# Patient Record
Sex: Male | Born: 1950 | Race: Black or African American | Hispanic: No | Marital: Single | Smoking: Current every day smoker
Health system: Southern US, Community
[De-identification: ages and names within clinical notes are randomized; demographics above are authoritative.]

## PROBLEM LIST (undated history)

## (undated) DIAGNOSIS — R569 Unspecified convulsions: Secondary | ICD-10-CM

## (undated) DIAGNOSIS — F209 Schizophrenia, unspecified: Secondary | ICD-10-CM

## (undated) DIAGNOSIS — I219 Acute myocardial infarction, unspecified: Secondary | ICD-10-CM

## (undated) HISTORY — PX: CARDIAC SURGERY: SHX584

## (undated) HISTORY — PX: AXILLARY VEIN - INTERNAL JUGULAR BYPASS GRAFT: SUR172

---

## 2010-03-21 ENCOUNTER — Emergency Department (HOSPITAL_COMMUNITY)
Admission: EM | Admit: 2010-03-21 | Discharge: 2010-03-21 | Payer: Self-pay | Source: Home / Self Care | Admitting: Emergency Medicine

## 2010-05-14 ENCOUNTER — Emergency Department (HOSPITAL_COMMUNITY)
Admission: EM | Admit: 2010-05-14 | Discharge: 2010-05-14 | Discharge status: Against Medical Advice | Payer: Medicaid Other | Attending: Emergency Medicine | Admitting: Emergency Medicine

## 2010-05-14 DIAGNOSIS — Y929 Unspecified place or not applicable: Secondary | ICD-10-CM | POA: Insufficient documentation

## 2010-05-14 DIAGNOSIS — Z79899 Other long term (current) drug therapy: Secondary | ICD-10-CM | POA: Insufficient documentation

## 2010-05-14 DIAGNOSIS — G40909 Epilepsy, unspecified, not intractable, without status epilepticus: Secondary | ICD-10-CM | POA: Insufficient documentation

## 2010-05-14 DIAGNOSIS — IMO0001 Reserved for inherently not codable concepts without codable children: Secondary | ICD-10-CM | POA: Insufficient documentation

## 2010-10-01 ENCOUNTER — Emergency Department (HOSPITAL_COMMUNITY)
Admission: EM | Admit: 2010-10-01 | Discharge: 2010-10-01 | Discharge status: Against Medical Advice | Payer: Medicaid Other | Source: Home / Self Care | Attending: Emergency Medicine | Admitting: Emergency Medicine

## 2010-10-01 ENCOUNTER — Emergency Department (HOSPITAL_COMMUNITY)
Admission: EM | Admit: 2010-10-01 | Discharge: 2010-10-01 | Discharge status: Against Medical Advice | Payer: Medicaid Other | Attending: Emergency Medicine | Admitting: Emergency Medicine

## 2010-10-01 DIAGNOSIS — R51 Headache: Secondary | ICD-10-CM | POA: Insufficient documentation

## 2010-10-01 DIAGNOSIS — R079 Chest pain, unspecified: Secondary | ICD-10-CM | POA: Insufficient documentation

## 2010-10-01 DIAGNOSIS — M79609 Pain in unspecified limb: Secondary | ICD-10-CM | POA: Insufficient documentation

## 2011-03-30 ENCOUNTER — Emergency Department (HOSPITAL_COMMUNITY)
Admission: EM | Admit: 2011-03-30 | Discharge: 2011-03-30 | Discharge status: Against Medical Advice | Payer: Medicaid Other | Attending: Emergency Medicine | Admitting: Emergency Medicine

## 2011-03-30 DIAGNOSIS — Z0389 Encounter for observation for other suspected diseases and conditions ruled out: Secondary | ICD-10-CM | POA: Insufficient documentation

## 2011-03-30 NOTE — ED Notes (Signed)
Pt sts he is going to cafeteria; pt speaking to everyone in waiting room; pt sts does not want to be seen now

## 2011-04-30 ENCOUNTER — Encounter (HOSPITAL_COMMUNITY): Payer: Self-pay

## 2011-04-30 ENCOUNTER — Emergency Department (HOSPITAL_COMMUNITY)
Admission: EM | Admit: 2011-04-30 | Discharge: 2011-04-30 | Disposition: A | Discharge status: Home / Self Care | Payer: Medicaid Other | Attending: Emergency Medicine | Admitting: Emergency Medicine

## 2011-04-30 ENCOUNTER — Emergency Department (HOSPITAL_COMMUNITY)
Admission: EM | Admit: 2011-04-30 | Discharge: 2011-04-30 | Disposition: A | Discharge status: Home / Self Care | Payer: Medicaid Other | Source: Home / Self Care | Attending: Emergency Medicine | Admitting: Emergency Medicine

## 2011-04-30 ENCOUNTER — Encounter (HOSPITAL_COMMUNITY): Payer: Self-pay | Admitting: *Deleted

## 2011-04-30 DIAGNOSIS — Z79899 Other long term (current) drug therapy: Secondary | ICD-10-CM | POA: Insufficient documentation

## 2011-04-30 DIAGNOSIS — Z8659 Personal history of other mental and behavioral disorders: Secondary | ICD-10-CM | POA: Insufficient documentation

## 2011-04-30 DIAGNOSIS — M79609 Pain in unspecified limb: Secondary | ICD-10-CM | POA: Insufficient documentation

## 2011-04-30 DIAGNOSIS — F209 Schizophrenia, unspecified: Secondary | ICD-10-CM | POA: Insufficient documentation

## 2011-04-30 DIAGNOSIS — M79676 Pain in unspecified toe(s): Secondary | ICD-10-CM

## 2011-04-30 DIAGNOSIS — R569 Unspecified convulsions: Secondary | ICD-10-CM | POA: Insufficient documentation

## 2011-04-30 DIAGNOSIS — Z5900 Homelessness unspecified: Secondary | ICD-10-CM | POA: Insufficient documentation

## 2011-04-30 DIAGNOSIS — B351 Tinea unguium: Secondary | ICD-10-CM | POA: Insufficient documentation

## 2011-04-30 DIAGNOSIS — F17200 Nicotine dependence, unspecified, uncomplicated: Secondary | ICD-10-CM | POA: Insufficient documentation

## 2011-04-30 HISTORY — DX: Unspecified convulsions: R56.9

## 2011-04-30 HISTORY — DX: Schizophrenia, unspecified: F20.9

## 2011-04-30 MED ORDER — LIDOCAINE HCL 2 % IJ SOLN
10.0000 mL | Freq: Once | INTRAMUSCULAR | Status: DC
Start: 2011-04-30 — End: 2011-05-01
  Filled 2011-04-30: qty 1

## 2011-04-30 NOTE — ED Notes (Signed)
CSW met with Pt to discuss living situation. Pt got in fight with wife and is now homeless. Pt has hx of homelessness and is familiar with local resources and process for shelter. Pt reports last at at [facility] 7 months ago. Pt spends day at [facility]. CSW gave Pt a few clothing items, bus pass, and list of places to eat each meal 7 days a week. Pt verbally expressed gratitude for assistance.

## 2011-04-30 NOTE — ED Notes (Signed)
Pt to be discharged home. Pt uncooperative with EDP and nursing staff. Social worker gave pt clothes. Pt provided with bus ticket and lunch. Pt refusing to sign discharge papers. Alert and oriented x 4. NAD. EDP reporting have security escort paper off campus. Security at bedside

## 2011-04-30 NOTE — ED Notes (Signed)
Pt reports ingrown toe nail to right great toe x 6 months. States getting worse, mild swelling noted.

## 2011-04-30 NOTE — Discharge Instructions (Signed)
Come back when you change your attitude and refrain from swearing at health care providers.

## 2011-04-30 NOTE — ED Notes (Signed)
Pt complains of right great toe nail is ingrown and is too long and wants it removed

## 2011-04-30 NOTE — ED Notes (Signed)
Patient reports he and his wife split last night,  He is now homeless.  Social worker called to come talk with patient

## 2011-04-30 NOTE — ED Provider Notes (Addendum)
History     CSN: [id number]  Arrival date & time 04/30/11  1001   First MD Initiated Contact with Patient 04/30/11 1102      Chief Complaint  Patient presents with  . Toe Pain    (Consider location/radiation/quality/duration/timing/severity/associated sxs/prior treatment) The history is provided by the patient.  pt c/o nontraumatic pain in right gt toe due to toenail.  Says it is ingrown.  No other complaints.  Past Medical History  Diagnosis Date  . Schizophrenia   . Seizures     History reviewed. No pertinent past surgical history.  History reviewed. No pertinent family history.  History  Substance Use Topics  . Smoking status: Current Everyday Smoker  . Smokeless tobacco: Not on file  . Alcohol Use: Yes      Review of Systems  Musculoskeletal:       Toe nail pain    Allergies  Review of patient's allergies indicates no known allergies.  Home Medications   Current Outpatient Rx  Name Route Sig Dispense Refill  . RISPERIDONE 0.25 MG PO TABS Oral Take 0.25 mg by mouth at bedtime.      BP 109/80  Pulse 81  Temp(Src) 98.3 F (36.8 C) (Oral)  Resp 20  SpO2 100%  Physical Exam  Constitutional: He appears well-developed and well-nourished. No distress.  HENT:  Head: Normocephalic.  Eyes: Pupils are equal, round, and reactive to light.  Neck: Normal range of motion.  Pulmonary/Chest: Effort normal.  Musculoskeletal:       Hypertrophied right toenail with fungal infection. Not ingrown.  No erythema, edema of soft tissue of toe.    ED Course  Procedures (including critical care time) Pt began swearing at me and using foul language.  I discharged him without any tx.  There is no medical emergency.  Labs Reviewed - No data to display No results found.   No diagnosis found.    MDM  Toe nail hypertrophy No ingrown nail        Nicholes Stairs, MD 04/30/11 1117  Nicholes Stairs, MD 04/30/11 1121

## 2011-04-30 NOTE — ED Notes (Signed)
Patient states the right great toe nail is ingrown and painful.  He has been dealing with the pain for 6 mths

## 2011-04-30 NOTE — ED Notes (Signed)
Patient is refusing to sign his d/c paper work.

## 2011-04-30 NOTE — ED Notes (Signed)
Pt was seen by [employer] to have been walking down [employer] toward [employer].

## 2011-04-30 NOTE — ED Provider Notes (Signed)
History     CSN: [id number]  Arrival date & time 04/30/11  2223   First MD Initiated Contact with Patient 04/30/11 2247      Chief Complaint  Patient presents with  . Ingrown Toenail    pt c/o pain to right great toe, states he has had broken toenail x's 6 months.     (Consider location/radiation/quality/duration/timing/severity/associated sxs/prior treatment) Patient is a 61 y.o. male presenting with toe pain. The history is provided by the patient. No language interpreter was used.  Toe Pain This is a recurrent problem. The current episode started in the past 7 days. The problem has been gradually worsening. Pertinent negatives include no fever, nausea, vomiting or weakness. The symptoms are aggravated by walking.  homeless individual with R toe pain x 6 months.  Past Medical History  Diagnosis Date  . Schizophrenia   . Seizures     Past Surgical History  Procedure Date  . 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 No pertinent family history.  History  Substance Use Topics  . Smoking status: Current Everyday Smoker  . Smokeless tobacco: Not on file  . Alcohol Use: Yes      Review of Systems  Constitutional: Negative for fever.  Gastrointestinal: Negative for nausea and vomiting.  Neurological: Negative for weakness.  All other systems reviewed and are negative.    Allergies  Review of patient's allergies indicates no known allergies.  Home Medications   Current Outpatient Rx  Name Route Sig Dispense Refill  . RISPERIDONE 0.25 MG PO TABS Oral Take 0.25 mg by mouth at bedtime.      BP 112/73  Pulse 87  Temp(Src) 98.4 F (36.9 C) (Oral)  Resp 20  Wt 136 lb 11.2 oz (62.007 kg)  SpO2 100%  Physical Exam  Nursing note and vitals reviewed. Constitutional: He is oriented to person, place, and time. He appears well-developed and well-nourished.  HENT:  Head: Normocephalic and atraumatic.  Eyes: Pupils are equal, round, and  reactive to light.  Neck: Neck supple.  Cardiovascular: Normal rate and regular rhythm.  Exam reveals no gallop and no friction rub.   No murmur heard. Pulmonary/Chest: Breath sounds normal. No respiratory distress.  Abdominal: Soft. He exhibits no distension.  Musculoskeletal: Normal range of motion.       R great toe pain with ingrown toenail  Neurological: He is alert and oriented to person, place, and time. No cranial nerve deficit.  Skin: Skin is warm and dry.  Psychiatric: He has a normal mood and affect.    ED Course  Procedures (including critical care time)  Labs Reviewed - No data to display No results found.   No diagnosis found.    MDM  Homeless Patient here in the current system for the second time today with complaint of right toe pain. States he does have a ingrown toenail and he would like to have the whole thing cut off tonight. When I went to numb  his toe up he would not let me,  therefore we will not do the procedure at this time. The toe does not appear to be infected. Patient spoke with the social worker at [employer] [facility] today. He was given clothes,  a bus pass and 7 meal tickets. He was escorted off campus by security. Will disposition him to discharge.        Jethro Bastos, NP 05/02/11 [phone]

## 2011-04-30 NOTE — ED Notes (Signed)
Pt asking to speak with social worker, states he is homeless, doesn't have anywhere to stay tonight or a ride home.

## 2011-04-30 NOTE — ED Notes (Signed)
Pt states he also wants to see a Child psychotherapist because he is homeless and needs help with getting appropriate clothing.

## 2011-05-02 NOTE — ED Provider Notes (Signed)
Medical screening examination/treatment/procedure(s) were performed by non-physician practitioner and as supervising physician I was immediately available for consultation/collaboration.   Glynn Octave, MD 05/02/11 1949

## 2011-05-03 NOTE — ED Provider Notes (Signed)
History     CSN: [id number]  Arrival date & time 04/30/11  1256   First MD Initiated Contact with Patient 04/30/11 1504      Chief Complaint  Patient presents with  . Toe Pain    (Consider location/radiation/quality/duration/timing/severity/associated sxs/prior treatment) Patient is a 61 y.o. male presenting with toe pain. The history is provided by the patient. No language interpreter was used.  Toe Pain This is a chronic problem. The current episode started more than 1 month ago. The problem occurs constantly. The problem has been gradually worsening. Pertinent negatives include no chills, fever, numbness or vomiting. The symptoms are aggravated by walking. He has tried nothing for the symptoms.  Reports ingrown toenail on the R big toe x 6 months.  Patient is homeless and walks a lot.  Was at [facility] facility earlier today and escorted off property after he was given food vouchers, clothes and bus pass.   Past Medical History  Diagnosis Date  . Schizophrenia   . Seizures     Past Surgical History  Procedure Date  . 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  History  Substance Use Topics  . Smoking status: Current Everyday Smoker  . Smokeless tobacco: Not on file  . Alcohol Use: Yes      Review of Systems  Constitutional: Negative for fever and chills.  Gastrointestinal: Negative for vomiting.  Neurological: Negative for numbness.  All other systems reviewed and are negative.    Allergies  Review of patient's allergies indicates no known allergies.  Home Medications   Current Outpatient Rx  Name Route Sig Dispense Refill  . RISPERIDONE 0.25 MG PO TABS Oral Take 0.25 mg by mouth at bedtime.      BP 123/84  Pulse 77  Temp(Src) 98 F (36.7 C) (Oral)  Resp 16  Physical Exam  Nursing note and vitals reviewed. Constitutional: He is oriented to person, place, and time. He appears well-developed and  well-nourished.  HENT:  Head: Normocephalic and atraumatic.  Eyes: Pupils are equal, round, and reactive to light.  Neck: Normal range of motion.  Cardiovascular: Normal rate and regular rhythm.  Exam reveals no gallop and no friction rub.   No murmur heard. Pulmonary/Chest: Effort normal and breath sounds normal. No respiratory distress.  Abdominal: Soft. He exhibits no distension.  Musculoskeletal: Normal range of motion.       R great toe tender to touch.  Does not appear infected or reddened.  Neurological: He is alert and oriented to person, place, and time. No cranial nerve deficit.  Skin: Skin is warm and dry.  Psychiatric: He has a normal mood and affect.    ED Course  Procedures (including critical care time)  Labs Reviewed - No data to display No results found.   No diagnosis found.    MDM  Attempted digital block for ? Ingrown toenail.  Patient refused the block.  Homeless and was escorted off [facility] property earlier today.  No acute distress noted.        Jethro Bastos, NP 05/03/11 1742  Jethro Bastos, NP 05/03/11 1743

## 2011-05-04 NOTE — ED Provider Notes (Signed)
Medical screening examination/treatment/procedure(s) were performed by non-physician practitioner and as supervising physician I was immediately available for consultation/collaboration.   Keilan Nichol, MD 05/04/11 0123 

## 2012-11-20 ENCOUNTER — Emergency Department (HOSPITAL_COMMUNITY)
Admission: EM | Admit: 2012-11-20 | Discharge: 2012-11-20 | Disposition: A | Discharge status: Home / Self Care | Payer: Medicaid Other | Attending: Emergency Medicine | Admitting: Emergency Medicine

## 2012-11-20 ENCOUNTER — Encounter (HOSPITAL_COMMUNITY): Payer: Self-pay | Admitting: Emergency Medicine

## 2012-11-20 DIAGNOSIS — Z8659 Personal history of other mental and behavioral disorders: Secondary | ICD-10-CM | POA: Insufficient documentation

## 2012-11-20 DIAGNOSIS — B351 Tinea unguium: Secondary | ICD-10-CM

## 2012-11-20 DIAGNOSIS — M25569 Pain in unspecified knee: Secondary | ICD-10-CM | POA: Insufficient documentation

## 2012-11-20 DIAGNOSIS — M25562 Pain in left knee: Secondary | ICD-10-CM

## 2012-11-20 DIAGNOSIS — L600 Ingrowing nail: Secondary | ICD-10-CM

## 2012-11-20 DIAGNOSIS — Z8669 Personal history of other diseases of the nervous system and sense organs: Secondary | ICD-10-CM | POA: Insufficient documentation

## 2012-11-20 DIAGNOSIS — Z79899 Other long term (current) drug therapy: Secondary | ICD-10-CM | POA: Insufficient documentation

## 2012-11-20 DIAGNOSIS — F17200 Nicotine dependence, unspecified, uncomplicated: Secondary | ICD-10-CM | POA: Insufficient documentation

## 2012-11-20 NOTE — ED Notes (Signed)
Post op shoe placed on right foot.

## 2012-11-20 NOTE — ED Provider Notes (Signed)
CSN: [id number]     Arrival date & time 11/20/12  1356 History   First MD Initiated Contact with Patient 11/20/12 1402     Chief Complaint  Patient presents with  . Knee Pain  . Toe Pain   (Consider location/radiation/quality/duration/timing/severity/associated sxs/prior Treatment) HPI Comments: Patient presents with two complaints.  Patient states he has had pain in his right great toenail for 2 years. States it has been actively hurting him for 9 months. States that the toenail is thickened and curls under and digs into his toe. His pain is distal and with his toenail. He has file that down and her podiatrist has file that down without improvement. Denies any fevers, numbness or weakness of his foot, and he discharged from the area. Patient states it is exacerbated by wearing shoes that are too small.  Patient also reports pain and swelling in his left knee x2 weeks. 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 He denies any injury to the knee, denies any history of gout.  No weakness or numbness of the extremity.  No fevers.     The history is provided by the patient.    Past Medical History  Diagnosis Date  . Schizophrenia   . Seizures    Past Surgical History  Procedure Laterality Date  . 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 History  Substance Use Topics  . Smoking status: Current Every Day Smoker  . Smokeless tobacco: Not on file  . Alcohol Use: Yes    Review of Systems  Constitutional: Negative for fever and chills.  Musculoskeletal: Positive for arthralgias.  Neurological: Negative for weakness and numbness.    Allergies  Review of patient's allergies indicates no known allergies.  Home Medications   Current Outpatient Rx  Name  Route  Sig  Dispense  Refill  . benztropine (COGENTIN) 0.5 MG  tablet   Oral   Take 0.5 mg by mouth 2 (two) times daily.         . haloperidol (HALDOL) 2 MG tablet   Oral   Take 2 mg by mouth 2 (two) times daily.          There were no vitals taken for this visit. Physical Exam  Nursing note and vitals reviewed. Constitutional: He appears well-developed and well-nourished. No distress.  HENT:  Head: Normocephalic and atraumatic.  Neck: Neck supple.  Pulmonary/Chest: Effort normal.  Musculoskeletal:       Left knee: He exhibits normal range of motion, no swelling, no effusion, no deformity, no laceration, no erythema, normal alignment, no LCL laxity, no bony tenderness and no MCL laxity.       Legs: Point tenderness over area of left knee, nonbony, appears to have very small bruise in this area.  Tenderness is overlying an old scar.  No erythema, edema, warmth. Calf is soft nontender. Distal pulses and sensation are intact.   Right great toe with chronically thickened yellow toenail. The toenail does curl around the edge and as sharp in the distal aspect. There is no edema, tenderness, warmth, discharge around the toenail. Capillary refill less than 2 seconds.   Neurological: He is alert.  Skin: He is not diaphoretic.    ED Course  Procedures (including critical care time) Labs Review Labs Reviewed -  No data to display Imaging Review No results found.  MDM   1. Onychomycosis   2. Ingrown toenail   3. Left knee pain    Patient with chronic onychomycosis and ingrown toenail of right great toe. He wears shoes that are too small exacerbating the problem. There is no active infection of the toe. Patient placed in postop shoe for comfort. Podiatry followup. Patient with point tenderness of left knee without any injury. There is no evidence of infection or inflammation. Doubt septic joint and gout. He is able to ambulate and has full range of motion. Exception of very localized area of tenderness the exam is normal. There is no laxity of the  joint and no evidence of ligamentous injury. Patient declines any medication for his pain.  Discussedfindings, treatment, and follow up  with patient.  Pt given return precautions.  Pt verbalizes understanding and agrees with plan.      I doubt any other EMC precluding discharge at this time including, but not necessarily limited to the following: Septic joint, infected ingrown toenail/cellulitis/abscess    Trixie Dredge, PA-C 11/20/12 1453

## 2012-11-20 NOTE — ED Notes (Signed)
Lt knee pain with swelling and rt toe pain with swelling thinks its an ingrown toe nail.

## 2012-11-22 NOTE — ED Provider Notes (Signed)
Medical screening examination/treatment/procedure(s) were performed by non-physician practitioner and as supervising physician I was immediately available for consultation/collaboration.  Charlann Wayne T Ahmaad Neidhardt, MD 11/22/12 2051 

## 2012-11-29 ENCOUNTER — Encounter (HOSPITAL_COMMUNITY): Payer: Self-pay

## 2012-11-29 ENCOUNTER — Emergency Department (HOSPITAL_COMMUNITY)
Admission: EM | Admit: 2012-11-29 | Discharge: 2012-11-29 | Disposition: A | Discharge status: Home / Self Care | Payer: Medicaid Other | Attending: Emergency Medicine | Admitting: Emergency Medicine

## 2012-11-29 ENCOUNTER — Emergency Department (HOSPITAL_COMMUNITY): Payer: Medicaid Other

## 2012-11-29 DIAGNOSIS — I252 Old myocardial infarction: Secondary | ICD-10-CM | POA: Insufficient documentation

## 2012-11-29 DIAGNOSIS — Z8669 Personal history of other diseases of the nervous system and sense organs: Secondary | ICD-10-CM | POA: Insufficient documentation

## 2012-11-29 DIAGNOSIS — Z79899 Other long term (current) drug therapy: Secondary | ICD-10-CM | POA: Insufficient documentation

## 2012-11-29 DIAGNOSIS — Z8739 Personal history of other diseases of the musculoskeletal system and connective tissue: Secondary | ICD-10-CM | POA: Insufficient documentation

## 2012-11-29 DIAGNOSIS — Z9889 Other specified postprocedural states: Secondary | ICD-10-CM | POA: Insufficient documentation

## 2012-11-29 DIAGNOSIS — M25569 Pain in unspecified knee: Secondary | ICD-10-CM | POA: Insufficient documentation

## 2012-11-29 DIAGNOSIS — F209 Schizophrenia, unspecified: Secondary | ICD-10-CM | POA: Insufficient documentation

## 2012-11-29 DIAGNOSIS — M25562 Pain in left knee: Secondary | ICD-10-CM

## 2012-11-29 DIAGNOSIS — F17200 Nicotine dependence, unspecified, uncomplicated: Secondary | ICD-10-CM | POA: Insufficient documentation

## 2012-11-29 HISTORY — DX: Acute myocardial infarction, unspecified: I21.9

## 2012-11-29 MED ORDER — NAPROXEN 375 MG PO TABS
375.0000 mg | ORAL_TABLET | Freq: Once | ORAL | Status: AC
  Administered 2012-11-29: 375 mg via ORAL
  Filled 2012-11-29: qty 1

## 2012-11-29 MED ORDER — NAPROXEN 375 MG PO TABS: 375.0000 mg | ORAL_TABLET | Freq: Two times a day (BID) | ORAL | Status: DC

## 2012-11-29 NOTE — ED Provider Notes (Signed)
Medical screening examination/treatment/procedure(s) were performed by non-physician practitioner and as supervising physician I was immediately available for consultation/collaboration.  Shon Baton, MD 11/29/12 [phone]

## 2012-11-29 NOTE — ED Notes (Signed)
Per EMS- patient c/o left knee swelling and pain. Patient states he was seen at [facility] ED for the same 4 days ago. Patient stated they couldn't find anything wrong with his knee.

## 2012-11-29 NOTE — ED Provider Notes (Signed)
CSN: [id number]     Arrival date & time 11/29/12  1231 History   First MD Initiated Contact with Patient 11/29/12 1239     Chief Complaint  Patient presents with  . Joint Swelling   (Consider location/radiation/quality/duration/timing/severity/associated sxs/prior Treatment) Patient is a 62 y.o. male presenting with knee pain. The history is provided by the patient. No language interpreter was used.  Knee Pain Location:  Knee Knee location:  L knee Associated symptoms: no fever   Associated symptoms comment:  He returns to the ED for continued knee pain and worsening swelling. He denies fever, pain above or below the knee or injury to the knee. He has a history of arthritis but states "this isn't arthritis" because it didn't swell in the past.    Past Medical History  Diagnosis Date  . Schizophrenia   . Seizures   . MI (myocardial infarction)    Past Surgical History  Procedure Laterality Date  . 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 Cardiac surgery     History reviewed. No pertinent family history. History  Substance Use Topics  . Smoking status: Current Every Day Smoker -- 1.00 packs/day    Types: Cigarettes  . Smokeless tobacco: Never Used  . Alcohol Use: Yes     Comment: periodically    Review of Systems  Constitutional: Negative for fever.  Musculoskeletal: Positive for arthralgias.  Skin: Negative for color change and wound.    Allergies  Review of patient's allergies indicates no known allergies.  Home Medications   Current Outpatient Rx  Name  Route  Sig  Dispense  Refill  . benztropine (COGENTIN) 0.5 MG tablet   Oral   Take 0.5 mg by mouth 2 (two) times daily.         . haloperidol (HALDOL) 2 MG tablet   Oral   Take 2 mg by mouth 2 (two) times daily.          BP 123/77  Pulse 71  Temp(Src) 98 F (36.7 C) (Oral)  Resp 20  Ht 6\' 1"  (1.854 m)  Wt 162 lb (73.483 kg)  BMI 21.38 kg/m2  SpO2 99% Physical Exam  Constitutional: He  is oriented to person, place, and time. He appears well-developed and well-nourished.  Neck: Normal range of motion.  Cardiovascular: Intact distal pulses.   Pulmonary/Chest: Effort normal.  Musculoskeletal: Normal range of motion.  Left knee has mild swelling medially. No redness or warmth. He has ability to bear full weight to the knee and is walking without difficulty. No evidence of trauma. No calf or thigh swelling or tenderness.   Neurological: He is alert and oriented to person, place, and time.  Skin: Skin is warm and dry.  Psychiatric: He has a normal mood and affect.    ED Course  Procedures (including critical care time) Labs Review Labs Reviewed - No data to display Imaging Review Dg Knee Complete 4 Views Left  11/29/2012   CLINICAL DATA:  62 year old male right knee pain and swelling.  EXAM: LEFT KNEE - COMPLETE 4+ VIEW  COMPARISON:  None.  FINDINGS: No joint effusion identified. Moderate to severe patellofemoral degenerative spurring. Patella intact. More moderate medial compartment degenerative spurring. Mild medial compartment joint space loss. No fracture or dislocation.  IMPRESSION: Degenerative changes. No acute osseous abnormality identified at the left knee.   Electronically Signed   By: Augusto Gamble M.D.   On: 11/29/2012 14:07    MDM  No diagnosis found.  1. Knee pain  No evidence of septic joint, infection otherwise, bony deformity or drainable effusion. Will refer to PCP and/or orthopedics. Pain medication offered but patient declined. Will give anti-inflammatory.    Arnoldo Hooker, PA-C 11/29/12 1441

## 2013-06-21 ENCOUNTER — Encounter (HOSPITAL_COMMUNITY): Payer: Self-pay | Admitting: Emergency Medicine

## 2013-06-21 ENCOUNTER — Emergency Department (HOSPITAL_COMMUNITY)
Admission: EM | Admit: 2013-06-21 | Discharge: 2013-06-21 | Disposition: A | Discharge status: Home / Self Care | Payer: Medicaid Other | Attending: Emergency Medicine | Admitting: Emergency Medicine

## 2013-06-21 ENCOUNTER — Emergency Department (HOSPITAL_COMMUNITY): Payer: Medicaid Other

## 2013-06-21 DIAGNOSIS — M25569 Pain in unspecified knee: Secondary | ICD-10-CM | POA: Insufficient documentation

## 2013-06-21 DIAGNOSIS — M25559 Pain in unspecified hip: Secondary | ICD-10-CM | POA: Insufficient documentation

## 2013-06-21 DIAGNOSIS — I252 Old myocardial infarction: Secondary | ICD-10-CM | POA: Insufficient documentation

## 2013-06-21 DIAGNOSIS — R209 Unspecified disturbances of skin sensation: Secondary | ICD-10-CM | POA: Insufficient documentation

## 2013-06-21 DIAGNOSIS — M1990 Unspecified osteoarthritis, unspecified site: Secondary | ICD-10-CM

## 2013-06-21 DIAGNOSIS — F209 Schizophrenia, unspecified: Secondary | ICD-10-CM | POA: Insufficient documentation

## 2013-06-21 DIAGNOSIS — F17200 Nicotine dependence, unspecified, uncomplicated: Secondary | ICD-10-CM | POA: Insufficient documentation

## 2013-06-21 DIAGNOSIS — G40909 Epilepsy, unspecified, not intractable, without status epilepticus: Secondary | ICD-10-CM | POA: Insufficient documentation

## 2013-06-21 DIAGNOSIS — Z79899 Other long term (current) drug therapy: Secondary | ICD-10-CM | POA: Insufficient documentation

## 2013-06-21 MED ORDER — OXYCODONE-ACETAMINOPHEN 5-325 MG PO TABS
2.0000 | ORAL_TABLET | Freq: Once | ORAL | Status: AC
  Administered 2013-06-21: 2 via ORAL
  Filled 2013-06-21: qty 2

## 2013-06-21 MED ORDER — OXYCODONE-ACETAMINOPHEN 5-325 MG PO TABS: 2.0000 | ORAL_TABLET | Freq: Four times a day (QID) | ORAL | Status: DC | PRN

## 2013-06-21 MED ORDER — IBUPROFEN 800 MG PO TABS
800.0000 mg | ORAL_TABLET | Freq: Once | ORAL | Status: AC
  Administered 2013-06-21: 800 mg via ORAL
  Filled 2013-06-21: qty 1

## 2013-06-21 NOTE — ED Provider Notes (Signed)
CSN: [id number]     Arrival date & time 06/21/13  1111 History   First MD Initiated Contact with Patient 06/21/13 1112     Chief Complaint  Patient presents with  . Knee Pain     (Consider location/radiation/quality/duration/timing/severity/associated sxs/prior Treatment) The history is provided by the patient.  Rad Gramling is a 63 y.o. male here with L hip and knee pain. Left hip and knee pain for the last 2 months. States that it's like a shooting pain from his back along the side of his leg. Has some paresthesias associated with it but denies any incontinence. Has been walking on the leg and denies any trauma. He was seen here in September of 2014 for the same problem and was diagnosed with arthritis. Has not been following up with anyone.    Past Medical History  Diagnosis Date  . Schizophrenia   . Seizures   . MI (myocardial infarction)    Past Surgical History  Procedure Laterality Date  . 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 Cardiac surgery     No family history on file. History  Substance Use Topics  . Smoking status: Current Every Day Smoker -- 1.00 packs/day    Types: Cigarettes  . Smokeless tobacco: Never Used  . Alcohol Use: Yes     Comment: periodically    Review of Systems  Musculoskeletal:       L hip and knee pain   All other systems reviewed and are negative.      Allergies  Review of patient's allergies indicates no known allergies.  Home Medications   Current Outpatient Rx  Name  Route  Sig  Dispense  Refill  . benztropine (COGENTIN) 0.5 MG tablet   Oral   Take 0.5 mg by mouth 2 (two) times daily.         . haloperidol (HALDOL) 2 MG tablet   Oral   Take 2 mg by mouth 2 (two) times daily.          BP 115/77  Pulse 70  Temp(Src) 98.2 F (36.8 C) (Oral)  Resp 20  SpO2 99% Physical Exam  Nursing note and vitals reviewed. Constitutional: He is oriented to person, place, and time.  Disheveled, slightly uncomfortable    HENT:  Head: Normocephalic.  Eyes: Conjunctivae are normal. Pupils are equal, round, and reactive to light.  Neck: Normal range of motion. Neck supple.  Cardiovascular: Normal rate, regular rhythm and normal heart sounds.   Pulmonary/Chest: Effort normal and breath sounds normal. No respiratory distress. He has no wheezes. He has no rales.  Abdominal: Bowel sounds are normal. He exhibits no distension. There is no tenderness. There is no rebound and no guarding.  Musculoskeletal:  No midline spinal tenderness. Nl ROM L hip. L knee slightly swollen, nl ROM. Mild diffuse tenderness. PCL, ACL intact   Neurological: He is alert and oriented to person, place, and time.  + straight leg raise L side. Mild dec sensation lateral aspect L leg but no saddle anesthesia.   Skin: Skin is warm and dry.  Psychiatric: He has a normal mood and affect. His behavior is normal. Judgment and thought content normal.    ED Course  Procedures (including critical care time) Labs Review Labs Reviewed - No data to display Imaging Review Dg Lumbar Spine Complete  06/21/2013   CLINICAL DATA:  Low back and left hip pain.  EXAM: LUMBAR SPINE - COMPLETE 4+ VIEW  COMPARISON:  None.  FINDINGS: Vertebral body height and alignment are maintained. Mild convex left scoliosis is noted. Intervertebral disc space height is normal. No pars interarticularis defect is identified. Facet joints are unremarkable.  IMPRESSION: Mild convex left scoliosis.  The study is otherwise negative.   Electronically Signed   By: Drusilla Kannerhomas  Dalessio M.D.   On: 06/21/2013 12:56   Dg Hip Complete Left  06/21/2013   CLINICAL DATA:  Left hip pain for 2 months.  EXAM: LEFT HIP - COMPLETE 2+ VIEW  COMPARISON:  None.  FINDINGS: There is no acute bony or joint abnormality. Mild to moderate degenerative change about the hips appear symmetric. No evidence of avascular necrosis is seen. No focal bony lesion is identified.  IMPRESSION: No acute or focal abnormality.   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   Electronically Signed   By: Drusilla Kannerhomas  Dalessio M.D.   On: 06/21/2013 12:55   Dg Knee Complete 4 Views Left  06/21/2013   CLINICAL DATA:  Left knee pain.  EXAM: LEFT KNEE - COMPLETE 4+ VIEW  COMPARISON:  None.  FINDINGS: No acute bony or joint abnormality is identified. Mild medial compartment joint space narrowing is present. Small osteophytes are seen about the medial and patellofemoral compartments.  IMPRESSION: No acute finding.  Mild degenerative change medial and patellofemoral compartments.   Electronically Signed   By: Drusilla Kannerhomas  Dalessio M.D.   On: 06/21/2013 12:57     EKG Interpretation None      MDM   Final diagnoses:  None   Ether GriffinsWilliam Jerrett is a 63 y.o. male here with L hip and knee pain. Likely sciatica vs pain from arthritis. Not concerned for cauda equina. Will give pain meds and get xrays.   1:19 PM Xray showed arthritis, no fracture. Given crutches, will refer to ortho.   Richardean Canalavid H Yao, MD 06/21/13 1320

## 2013-06-21 NOTE — ED Notes (Signed)
MD at bedside. 

## 2013-06-21 NOTE — Discharge Instructions (Signed)
Use crutches.   Stay off your left leg.   You likely have osteoarthritis.   Follow up with an orthopedic doctor.   Return to ER if you have severe pain, unable to walk.
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
## 2013-09-29 ENCOUNTER — Encounter (HOSPITAL_COMMUNITY): Payer: Self-pay | Admitting: Emergency Medicine

## 2013-09-29 ENCOUNTER — Emergency Department (HOSPITAL_COMMUNITY): Payer: Medicaid Other

## 2013-09-29 ENCOUNTER — Emergency Department (HOSPITAL_COMMUNITY)
Admission: EM | Admit: 2013-09-29 | Discharge: 2013-09-29 | Discharge status: Against Medical Advice | Payer: Medicaid Other | Attending: Emergency Medicine | Admitting: Emergency Medicine

## 2013-09-29 DIAGNOSIS — R42 Dizziness and giddiness: Secondary | ICD-10-CM | POA: Diagnosis not present

## 2013-09-29 DIAGNOSIS — Y9389 Activity, other specified: Secondary | ICD-10-CM | POA: Insufficient documentation

## 2013-09-29 DIAGNOSIS — Y9289 Other specified places as the place of occurrence of the external cause: Secondary | ICD-10-CM | POA: Insufficient documentation

## 2013-09-29 DIAGNOSIS — S4980XA Other specified injuries of shoulder and upper arm, unspecified arm, initial encounter: Secondary | ICD-10-CM | POA: Insufficient documentation

## 2013-09-29 DIAGNOSIS — IMO0002 Reserved for concepts with insufficient information to code with codable children: Secondary | ICD-10-CM | POA: Diagnosis not present

## 2013-09-29 DIAGNOSIS — S46909A Unspecified injury of unspecified muscle, fascia and tendon at shoulder and upper arm level, unspecified arm, initial encounter: Secondary | ICD-10-CM | POA: Insufficient documentation

## 2013-09-29 DIAGNOSIS — S6990XA Unspecified injury of unspecified wrist, hand and finger(s), initial encounter: Secondary | ICD-10-CM | POA: Diagnosis present

## 2013-09-29 LAB — CBC WITH DIFFERENTIAL/PLATELET
Basophils Absolute: 0 10*3/uL (ref 0.0–0.1)
Basophils Relative: 0 % (ref 0–1)
EOS PCT: 4 % (ref 0–5)
Eosinophils Absolute: 0.2 10*3/uL (ref 0.0–0.7)
HEMATOCRIT: 47 % (ref 39.0–52.0)
Hemoglobin: 16 g/dL (ref 13.0–17.0)
LYMPHS ABS: 1.6 10*3/uL (ref 0.7–4.0)
LYMPHS PCT: 29 % (ref 12–46)
MCH: 28.9 pg (ref 26.0–34.0)
MCHC: 34 g/dL (ref 30.0–36.0)
MCV: 85 fL (ref 78.0–100.0)
MONO ABS: 0.4 10*3/uL (ref 0.1–1.0)
MONOS PCT: 6 % (ref 3–12)
Neutro Abs: 3.4 10*3/uL (ref 1.7–7.7)
Neutrophils Relative %: 61 % (ref 43–77)
Platelets: 330 10*3/uL (ref 150–400)
RBC: 5.53 MIL/uL (ref 4.22–5.81)
RDW: 15 % (ref 11.5–15.5)
WBC: 5.6 10*3/uL (ref 4.0–10.5)

## 2013-09-29 LAB — COMPREHENSIVE METABOLIC PANEL
ALBUMIN: 3.9 g/dL (ref 3.5–5.2)
ALT: 23 U/L (ref 0–53)
AST: 28 U/L (ref 0–37)
Alkaline Phosphatase: 87 U/L (ref 39–117)
Anion gap: 15 (ref 5–15)
BUN: 16 mg/dL (ref 6–23)
CALCIUM: 9.3 mg/dL (ref 8.4–10.5)
CHLORIDE: 97 meq/L (ref 96–112)
CO2: 25 mEq/L (ref 19–32)
CREATININE: 0.82 mg/dL (ref 0.50–1.35)
GFR calc Af Amer: >90 mL/min (ref >90)
GFR calc non Af Amer: >90 mL/min (ref >90)
Glucose, Bld: 156 mg/dL — ABNORMAL HIGH (ref 70–99)
Potassium: 3.9 mEq/L (ref 3.7–5.3)
Sodium: 137 mEq/L (ref 137–147)
Total Bilirubin: 1.2 mg/dL (ref 0.3–1.2)
Total Protein: 7.9 g/dL (ref 6.0–8.3)

## 2013-09-29 NOTE — ED Notes (Signed)
Pt seems lethargic but reports he hasnt had anything to drink in 2 days.

## 2013-09-29 NOTE — ED Notes (Signed)
Pager [phone] 

## 2013-09-29 NOTE — ED Notes (Signed)
Unable to locate patient for room x3 
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
## 2013-09-29 NOTE — ED Notes (Signed)
Unable to locate patient.

## 2013-09-29 NOTE — ED Notes (Signed)
Unable to locate for room

## 2013-10-03 ENCOUNTER — Encounter (HOSPITAL_COMMUNITY): Payer: Self-pay | Admitting: Emergency Medicine

## 2013-10-03 ENCOUNTER — Emergency Department (HOSPITAL_COMMUNITY): Payer: Medicaid Other

## 2013-10-03 ENCOUNTER — Emergency Department (HOSPITAL_COMMUNITY)
Admission: EM | Admit: 2013-10-03 | Discharge: 2013-10-03 | Disposition: A | Discharge status: Home / Self Care | Payer: Medicaid Other | Attending: Emergency Medicine | Admitting: Emergency Medicine

## 2013-10-03 DIAGNOSIS — S46909A Unspecified injury of unspecified muscle, fascia and tendon at shoulder and upper arm level, unspecified arm, initial encounter: Secondary | ICD-10-CM | POA: Diagnosis present

## 2013-10-03 DIAGNOSIS — Z79899 Other long term (current) drug therapy: Secondary | ICD-10-CM | POA: Insufficient documentation

## 2013-10-03 DIAGNOSIS — S63101A Unspecified subluxation of right thumb, initial encounter: Secondary | ICD-10-CM

## 2013-10-03 DIAGNOSIS — S99929A Unspecified injury of unspecified foot, initial encounter: Secondary | ICD-10-CM

## 2013-10-03 DIAGNOSIS — S99919A Unspecified injury of unspecified ankle, initial encounter: Secondary | ICD-10-CM | POA: Diagnosis not present

## 2013-10-03 DIAGNOSIS — Y929 Unspecified place or not applicable: Secondary | ICD-10-CM | POA: Insufficient documentation

## 2013-10-03 DIAGNOSIS — R296 Repeated falls: Secondary | ICD-10-CM | POA: Insufficient documentation

## 2013-10-03 DIAGNOSIS — S63259A Unspecified dislocation of unspecified finger, initial encounter: Secondary | ICD-10-CM | POA: Insufficient documentation

## 2013-10-03 DIAGNOSIS — Y9389 Activity, other specified: Secondary | ICD-10-CM | POA: Diagnosis not present

## 2013-10-03 DIAGNOSIS — S4992XA Unspecified injury of left shoulder and upper arm, initial encounter: Secondary | ICD-10-CM

## 2013-10-03 DIAGNOSIS — S8991XA Unspecified injury of right lower leg, initial encounter: Secondary | ICD-10-CM

## 2013-10-03 DIAGNOSIS — I252 Old myocardial infarction: Secondary | ICD-10-CM | POA: Insufficient documentation

## 2013-10-03 DIAGNOSIS — Z8669 Personal history of other diseases of the nervous system and sense organs: Secondary | ICD-10-CM | POA: Diagnosis not present

## 2013-10-03 DIAGNOSIS — F17200 Nicotine dependence, unspecified, uncomplicated: Secondary | ICD-10-CM | POA: Insufficient documentation

## 2013-10-03 DIAGNOSIS — S4980XA Other specified injuries of shoulder and upper arm, unspecified arm, initial encounter: Secondary | ICD-10-CM | POA: Insufficient documentation

## 2013-10-03 DIAGNOSIS — S8990XA Unspecified injury of unspecified lower leg, initial encounter: Secondary | ICD-10-CM | POA: Insufficient documentation

## 2013-10-03 MED ORDER — TRAMADOL HCL 50 MG PO TABS: 50.0000 mg | ORAL_TABLET | Freq: Four times a day (QID) | ORAL | Status: DC | PRN
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
## 2013-10-03 NOTE — Discharge Instructions (Signed)
Take tramadol as needed for pain. Follow up with your doctor for further evaluation.
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
## 2013-10-07 NOTE — ED Provider Notes (Signed)
Medical screening examination/treatment/procedure(s) were conducted as a shared visit with non-physician practitioner(s) and myself.  I personally evaluated the patient during the encounter.   EKG Interpretation None     See other note.   Raeford RazorStephen Ido Wollman, MD 10/07/13 1255

## 2014-12-27 ENCOUNTER — Encounter (HOSPITAL_COMMUNITY): Payer: Self-pay | Admitting: Emergency Medicine

## 2014-12-27 ENCOUNTER — Emergency Department (HOSPITAL_COMMUNITY)
Admission: EM | Admit: 2014-12-27 | Discharge: 2014-12-27 | Disposition: A | Discharge status: Home / Self Care | Payer: Medicaid Other | Attending: Emergency Medicine | Admitting: Emergency Medicine

## 2014-12-27 DIAGNOSIS — Z79899 Other long term (current) drug therapy: Secondary | ICD-10-CM | POA: Insufficient documentation

## 2014-12-27 DIAGNOSIS — I252 Old myocardial infarction: Secondary | ICD-10-CM | POA: Insufficient documentation

## 2014-12-27 DIAGNOSIS — Z720 Tobacco use: Secondary | ICD-10-CM | POA: Diagnosis not present

## 2014-12-27 DIAGNOSIS — Z8659 Personal history of other mental and behavioral disorders: Secondary | ICD-10-CM | POA: Insufficient documentation

## 2014-12-27 DIAGNOSIS — L84 Corns and callosities: Secondary | ICD-10-CM

## 2014-12-27 DIAGNOSIS — M79674 Pain in right toe(s): Secondary | ICD-10-CM | POA: Diagnosis present

## 2014-12-27 MED ORDER — IBUPROFEN 800 MG PO TABS: 800.0000 mg | ORAL_TABLET | Freq: Three times a day (TID) | ORAL | Status: DC

## 2014-12-27 MED ORDER — IBUPROFEN 800 MG PO TABS
800.0000 mg | ORAL_TABLET | Freq: Once | ORAL | Status: DC
  Filled 2014-12-27: qty 1

## 2014-12-27 NOTE — ED Notes (Signed)
Pt did not wait for medication administration or discharge instruction,pt is not in room neither in surrounding.

## 2014-12-27 NOTE — ED Provider Notes (Signed)
CSN: [id number]     Arrival date & time 12/27/14  1021 History   First MD Initiated Contact with Patient 12/27/14 1040     Chief Complaint  Patient presents with  . Toe Pain     (Consider location/radiation/quality/duration/timing/severity/associated sxs/prior Treatment) HPI Comments: Patient is a 64 year old male with past medical history of schizophrenia and seizure who presents to the ED with complaint of right second toe pain. Patient reports he has been having pain to the bottom of his right second toe for the past 6 months due to a callus. He notes the pain is gotten worse over the past few weeks. He states he has been doing warm and cold Epsom salt baths at home with mild relief. Denies any redness, swelling, drainage, fever, numbness, tingling, weakness. Patient denies any injury, trauma to his right foot.  Patient is a 64 y.o. male presenting with toe pain.  Toe Pain Pertinent negatives include no arthralgias, fever, joint swelling, myalgias, numbness or weakness.    Past Medical History  Diagnosis Date  . Schizophrenia (HCC)   . Seizures (HCC)   . MI (myocardial infarction) [facility])    Past Surgical History  Procedure Laterality Date  . 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 Cardiac surgery     No family history on file. Social History  Substance Use Topics  . Smoking status: Current Every Day Smoker -- 1.00 packs/day    Types: Cigarettes  . Smokeless tobacco: Never Used  . Alcohol Use: Yes     Comment: periodically    Review of Systems  Constitutional: Negative for fever.  Musculoskeletal: Negative for myalgias, joint swelling and arthralgias.       Toe pain  Skin: Negative for wound.  Neurological: Negative for weakness and numbness.      Allergies  Review of patient's allergies indicates no known allergies.  Home Medications   Prior to Admission medications   Medication Sig Start Date End Date Taking? Authorizing Provider  benztropine  (COGENTIN) 0.5 MG tablet Take 0.5 mg by mouth 2 (two) times daily.    Historical Provider, MD  haloperidol (HALDOL) 2 MG tablet Take 2 mg by mouth 2 (two) times daily.    Historical Provider, MD  traMADol (ULTRAM) 50 MG tablet Take 1 tablet (50 mg total) by mouth every 6 (six) hours as needed. 10/03/13   Kaitlyn Szekalski, PA-C   BP 104/65 mmHg  Pulse 74  Temp(Src) 98 F (36.7 C) (Oral)  Resp 17  SpO2 99% Physical Exam  Constitutional: He is oriented to person, place, and time. He appears well-developed and well-nourished.  HENT:  Head: Normocephalic and atraumatic.  Eyes: Conjunctivae and EOM are normal. Right eye exhibits no discharge. Left eye exhibits no discharge. No scleral icterus.  Neck: Normal range of motion. Neck supple.  Pulmonary/Chest: Effort normal.  Musculoskeletal: Normal range of motion. He exhibits no edema or tenderness.       Right foot: Normal. There is normal range of motion, no tenderness, no bony tenderness, no swelling, normal capillary refill, no crepitus, no deformity and no laceration.       Feet:  Neurological: He is alert and oriented to person, place, and time.  Nursing note and vitals reviewed.   ED Course  Procedures (including critical care time) Labs Review Labs Reviewed - No data to display  Imaging Review No results found. I have personally reviewed and evaluated these images and lab results as part of my medical decision-making.  Filed Vitals:   12/27/14 1115  BP: 104/65  Pulse: 74  Temp: 98 F (36.7 C)  Resp: 17    Meds given in ED:  Medications - No data to display  Discharge Medication List as of 12/27/2014 11:51 AM    START taking these medications   Details  ibuprofen (ADVIL,MOTRIN) 800 MG tablet Take 1 tablet (800 mg total) by mouth 3 (three) times daily., Starting 12/27/2014, Until Discontinued, Print        MDM   Final diagnoses:  Callus of foot    Patient presents with right second toe pain. Reports he has had  a callus on his toe for the past 6 months. No relief with at home remedies. Exam reveals small callus to the plantar aspect of the distal right second toe. No evidence of infection. Plan to discharge patient home and advised patient to follow up with primary care provider and continue using Epsom salt soaks and getting a pumice stone.  Evaluation does not show pathology requring ongoing emergent intervention or admission. Pt is hemodynamically stable and mentating appropriately. Discussed findings/results and plan with patient/guardian, who agrees with plan. All questions answered. Return precautions discussed and outpatient follow up given.      Satira Sark Knapp, [place] 12/27/14 2120  Elwin Mocha, MD 12/28/14 [phone]

## 2014-12-27 NOTE — Discharge Instructions (Signed)
Take ibuprofen as prescribed for pain relief. Follow up with your primary care provider in 1 week for follow up. Please return to the Emergency Department if symptoms worsen or new onset of fever, redness, swelling, drainge.

## 2014-12-27 NOTE — ED Notes (Signed)
Per EMS: Pt c/o pain from callus on 2nd toe of rt foot.

## 2015-01-05 ENCOUNTER — Emergency Department (HOSPITAL_COMMUNITY): Payer: Medicaid Other

## 2015-01-05 ENCOUNTER — Encounter (HOSPITAL_COMMUNITY): Payer: Self-pay | Admitting: Emergency Medicine

## 2015-01-05 ENCOUNTER — Emergency Department (HOSPITAL_COMMUNITY)
Admission: EM | Admit: 2015-01-05 | Discharge: 2015-01-05 | Disposition: A | Discharge status: Home / Self Care | Payer: Medicaid Other | Attending: Emergency Medicine | Admitting: Emergency Medicine

## 2015-01-05 DIAGNOSIS — S0990XA Unspecified injury of head, initial encounter: Secondary | ICD-10-CM | POA: Insufficient documentation

## 2015-01-05 DIAGNOSIS — Z9889 Other specified postprocedural states: Secondary | ICD-10-CM | POA: Insufficient documentation

## 2015-01-05 DIAGNOSIS — Z23 Encounter for immunization: Secondary | ICD-10-CM | POA: Insufficient documentation

## 2015-01-05 DIAGNOSIS — Y998 Other external cause status: Secondary | ICD-10-CM | POA: Insufficient documentation

## 2015-01-05 DIAGNOSIS — Y9289 Other specified places as the place of occurrence of the external cause: Secondary | ICD-10-CM | POA: Diagnosis not present

## 2015-01-05 DIAGNOSIS — Z720 Tobacco use: Secondary | ICD-10-CM | POA: Diagnosis not present

## 2015-01-05 DIAGNOSIS — Y9389 Activity, other specified: Secondary | ICD-10-CM | POA: Insufficient documentation

## 2015-01-05 DIAGNOSIS — S299XXA Unspecified injury of thorax, initial encounter: Secondary | ICD-10-CM | POA: Diagnosis present

## 2015-01-05 DIAGNOSIS — S4992XA Unspecified injury of left shoulder and upper arm, initial encounter: Secondary | ICD-10-CM | POA: Insufficient documentation

## 2015-01-05 DIAGNOSIS — S0083XA Contusion of other part of head, initial encounter: Secondary | ICD-10-CM | POA: Diagnosis not present

## 2015-01-05 DIAGNOSIS — Z8659 Personal history of other mental and behavioral disorders: Secondary | ICD-10-CM | POA: Diagnosis not present

## 2015-01-05 DIAGNOSIS — I252 Old myocardial infarction: Secondary | ICD-10-CM | POA: Insufficient documentation

## 2015-01-05 DIAGNOSIS — S2242XA Multiple fractures of ribs, left side, initial encounter for closed fracture: Secondary | ICD-10-CM | POA: Diagnosis not present

## 2015-01-05 DIAGNOSIS — S2232XA Fracture of one rib, left side, initial encounter for closed fracture: Secondary | ICD-10-CM

## 2015-01-05 MED ORDER — TETANUS-DIPHTH-ACELL PERTUSSIS 5-2.5-18.5 LF-MCG/0.5 IM SUSP
0.5000 mL | Freq: Once | INTRAMUSCULAR | Status: AC
  Administered 2015-01-05: 0.5 mL via INTRAMUSCULAR
  Filled 2015-01-05: qty 0.5

## 2015-01-05 MED ORDER — HYDROCODONE-ACETAMINOPHEN 5-325 MG PO TABS: 1.0000 | ORAL_TABLET | ORAL | Status: DC | PRN

## 2015-01-05 MED ORDER — PENICILLIN V POTASSIUM 500 MG PO TABS: 500.0000 mg | ORAL_TABLET | Freq: Four times a day (QID) | ORAL | Status: AC

## 2015-01-05 NOTE — Discharge Instructions (Signed)
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
## 2015-01-05 NOTE — ED Notes (Addendum)
Patient left prior to discharge, informed that discharge instructions and paperwork had not been performed or given, pt states he is leaving anyway. Patient given incentive spirometer but left prior to this nurse being able to instruct patient on proper use of incentive spirometer.
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
## 2015-01-05 NOTE — ED Notes (Signed)
Patient found smoking cigarette in room. Pt informed that this is unacceptable for multiple reasons. Door left open for remainder of patient stay.

## 2015-07-27 ENCOUNTER — Emergency Department (HOSPITAL_COMMUNITY)
Admission: EM | Admit: 2015-07-27 | Discharge: 2015-07-27 | Disposition: A | Discharge status: Home / Self Care | Payer: Medicaid Other | Attending: Emergency Medicine | Admitting: Emergency Medicine

## 2015-07-27 ENCOUNTER — Encounter (HOSPITAL_COMMUNITY): Payer: Self-pay | Admitting: Emergency Medicine

## 2015-07-27 ENCOUNTER — Emergency Department (HOSPITAL_COMMUNITY)
Admission: EM | Admit: 2015-07-27 | Discharge: 2015-07-27 | Discharge status: Against Medical Advice | Payer: Medicaid Other

## 2015-07-27 ENCOUNTER — Emergency Department (HOSPITAL_COMMUNITY): Payer: Medicaid Other

## 2015-07-27 DIAGNOSIS — I252 Old myocardial infarction: Secondary | ICD-10-CM | POA: Insufficient documentation

## 2015-07-27 DIAGNOSIS — Y998 Other external cause status: Secondary | ICD-10-CM | POA: Diagnosis not present

## 2015-07-27 DIAGNOSIS — S29001A Unspecified injury of muscle and tendon of front wall of thorax, initial encounter: Secondary | ICD-10-CM | POA: Diagnosis present

## 2015-07-27 DIAGNOSIS — Z79899 Other long term (current) drug therapy: Secondary | ICD-10-CM | POA: Diagnosis not present

## 2015-07-27 DIAGNOSIS — F209 Schizophrenia, unspecified: Secondary | ICD-10-CM | POA: Insufficient documentation

## 2015-07-27 DIAGNOSIS — Y9301 Activity, walking, marching and hiking: Secondary | ICD-10-CM | POA: Diagnosis not present

## 2015-07-27 DIAGNOSIS — S2241XA Multiple fractures of ribs, right side, initial encounter for closed fracture: Secondary | ICD-10-CM | POA: Insufficient documentation

## 2015-07-27 DIAGNOSIS — Z23 Encounter for immunization: Secondary | ICD-10-CM | POA: Diagnosis not present

## 2015-07-27 DIAGNOSIS — F17210 Nicotine dependence, cigarettes, uncomplicated: Secondary | ICD-10-CM | POA: Insufficient documentation

## 2015-07-27 DIAGNOSIS — Y9289 Other specified places as the place of occurrence of the external cause: Secondary | ICD-10-CM | POA: Diagnosis not present

## 2015-07-27 DIAGNOSIS — S60414A Abrasion of right ring finger, initial encounter: Secondary | ICD-10-CM

## 2015-07-27 DIAGNOSIS — S60212A Contusion of left wrist, initial encounter: Secondary | ICD-10-CM | POA: Diagnosis not present

## 2015-07-27 MED ORDER — HYDROCODONE-ACETAMINOPHEN 5-325 MG PO TABS
1.0000 | ORAL_TABLET | Freq: Once | ORAL | Status: AC
  Administered 2015-07-27: 1 via ORAL
  Filled 2015-07-27: qty 1

## 2015-07-27 MED ORDER — HYDROCODONE-ACETAMINOPHEN 5-325 MG PO TABS: 1.0000 | ORAL_TABLET | ORAL | Status: DC | PRN

## 2015-07-27 MED ORDER — TETANUS-DIPHTH-ACELL PERTUSSIS 5-2.5-18.5 LF-MCG/0.5 IM SUSP
0.5000 mL | Freq: Once | INTRAMUSCULAR | Status: AC
  Administered 2015-07-27: 0.5 mL via INTRAMUSCULAR
  Filled 2015-07-27: qty 0.5

## 2015-07-27 MED ORDER — CEPHALEXIN 500 MG PO CAPS: 1000.0000 mg | ORAL_CAPSULE | Freq: Two times a day (BID) | ORAL | Status: DC
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
## 2015-07-27 NOTE — Discharge Instructions (Signed)
You have broken ribs 8-9 on the right side.  Use incentive spirometer several times each hr during the day to help keep your lung well aerated and decrease risk of lung infection.  Keep your finger clean and wrapped to decrease risk of infection.  Take pain medication as needed for pain.  Follow up with orthopedist as needed for further care.  Contusion A contusion is a deep bruise. Contusions happen when an injury causes bleeding under the skin. Symptoms of bruising include pain, swelling, and discolored skin. The skin may turn blue, purple, or yellow. HOME CARE   Rest the injured area.  If told, put ice on the injured area.  Put ice in a plastic bag.  Place a towel between your skin and the bag.  Leave the ice on for 20 minutes, 2-3 times per day.  If told, put light pressure (compression) on the injured area using an elastic bandage. Make sure the bandage is not too tight. Remove it and put it back on as told by your doctor.  If possible, raise (elevate) the injured area above the level of your heart while you are sitting or lying down.  Take over-the-counter and prescription medicines only as told by your doctor. GET HELP IF:  Your symptoms do not get better after several days of treatment.  Your symptoms get worse.  You have trouble moving the injured area. GET HELP RIGHT AWAY IF:   You have very bad pain.  You have a loss of feeling (numbness) in a hand or foot.  Your hand or foot turns pale or cold.   This information is not intended to replace advice given to you by your health care provider. Make sure you discuss any questions you have with your health care provider.   Document Released: 08/21/2007 Document Revised: 11/23/2014 Document Reviewed: 07/20/2014 Elsevier Interactive Patient Education 2016 [employer]  Rib Fracture A rib fracture is a break or crack in one of the bones of the ribs. The ribs are a group of long, curved bones that wrap around your chest  and attach to your spine. They protect your lungs and other organs in the chest cavity. A broken or cracked rib is often painful, but most do not cause other problems. Most rib fractures heal on their own over time. However, rib fractures can be more serious if multiple ribs are broken or if broken ribs move out of place and push against other structures. CAUSES   A direct blow to the chest. For example, this could happen during contact sports, a car accident, or a fall against a hard object.  Repetitive movements with high force, such as pitching a baseball or having severe coughing spells. SYMPTOMS   Pain when you breathe in or cough.  Pain when someone presses on the injured area. DIAGNOSIS  Your caregiver will perform a physical exam. Various imaging tests may be ordered to confirm the diagnosis and to look for related injuries. These tests may include a chest X-ray, computed tomography (CT), magnetic resonance imaging (MRI), or a bone scan. TREATMENT  Rib fractures usually heal on their own in 1-3 months. The longer healing period is often associated with a continued cough or other aggravating activities. During the healing period, pain control is very important. Medication is usually given to control pain. Hospitalization or surgery may be needed for more severe injuries, such as those in which multiple ribs are broken or the ribs have moved out of place.  HOME CARE  INSTRUCTIONS   Avoid strenuous activity and any activities or movements that cause pain. Be careful during activities and avoid bumping the injured rib.  Gradually increase activity as directed by your caregiver.  Only take over-the-counter or prescription medications as directed by your caregiver. Do not take other medications without asking your caregiver first.  Apply ice to the injured area for the first 1-2 days after you have been treated or as directed by your caregiver. Applying ice helps to reduce inflammation and  pain.  Put ice in a plastic bag.  Place a towel between your skin and the bag.   Leave the ice on for 15-20 minutes at a time, every 2 hours while you are awake.  Perform deep breathing as directed by your caregiver. This will help prevent pneumonia, which is a common complication of a broken rib. Your caregiver may instruct you to:  Take deep breaths several times a day.  Try to cough several times a day, holding a pillow against the injured area.  Use a device called an incentive spirometer to practice deep breathing several times a day.  Drink enough fluids to keep your urine clear or pale yellow. This will help you avoid constipation.   Do not wear a rib belt or binder. These restrict breathing, which can lead to pneumonia.  SEEK IMMEDIATE MEDICAL CARE IF:   You have a fever.   You have difficulty breathing or shortness of breath.   You develop a continual cough, or you cough up thick or bloody sputum.  You feel sick to your stomach (nausea), throw up (vomit), or have abdominal pain.   You have worsening pain not controlled with medications.  MAKE SURE YOU:  Understand these instructions.  Will watch your condition.  Will get help right away if you are not doing well or get worse.   This information is not intended to replace advice given to you by your health care provider. Make sure you discuss any questions you have with your health care provider.   Document Released: 03/04/2005 Document Revised: 11/04/2012 Document Reviewed: 05/06/2012 Elsevier Interactive Patient Education 2016 [employer] RICE for Routine Care of Injuries Theroutine careofmanyinjuriesincludes rest, ice, compression, and elevation (RICE therapy). RICE therapy is often recommended for injuries to soft tissues, such as a muscle strain, ligament injuries, bruises, and overuse injuries. It can also be used for some bony injuries. Using RICE therapy can help to relieve pain, lessen  swelling, and enable your body to heal. Rest Rest is required to allow your body to heal. This usually involves reducing your normal activities and avoiding use of the injured part of your body. Generally, you can return to your normal activities when you are comfortable and have been given permission by your health care provider. Ice Icing your injury helps to keep the swelling down, and it lessens pain. Do not apply ice directly to your skin.  Put ice in a plastic bag.  Place a towel between your skin and the bag.  Leave the ice on for 20 minutes, 2-3 times a day. Do this for as long as you are directed by your health care provider. Compression Compression means putting pressure on the injured area. Compression helps to keep swelling down, gives support, and helps with discomfort. Compression may be done with an elastic bandage. If an elastic bandage has been applied, follow these general tips:  Remove and reapply the bandage every 3-4 hours or as directed by your health care provider.  Make sure the bandage is not wrapped too tightly, because this can cut off circulation. If part of your body beyond the bandage becomes blue, numb, cold, swollen, or more painful, your bandage is most likely too tight. If this occurs, remove your bandage and reapply it more loosely.  See your health care provider if the bandage seems to be making your problems worse rather than better. Elevation Elevation means keeping the injured area raised. This helps to lessen swelling and decrease pain. If possible, your injured area should be elevated at or above the level of your heart or the center of your chest. WHEN SHOULD I SEEK MEDICAL CARE? You should seek medical care if:  Your pain and swelling continue.  Your symptoms are getting worse rather than improving. These symptoms may indicate that further evaluation or further X-rays are needed. Sometimes, X-rays may not show a small broken bone (fracture) until  a number of days later. Make a follow-up appointment with your health care provider. WHEN SHOULD I SEEK IMMEDIATE MEDICAL CARE? You should seek immediate medical care if:  You have sudden severe pain at or below the area of your injury.  You have redness or increased swelling around your injury.  You have tingling or numbness at or below the area of your injury that does not improve after you remove the elastic bandage.   This information is not intended to replace advice given to you by your health care provider. Make sure you discuss any questions you have with your health care provider.   Document Released: 06/16/2000 Document Revised: 11/23/2014 Document Reviewed: 02/09/2014 Elsevier Interactive Patient Education [employer]
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
## 2015-07-27 NOTE — ED Notes (Signed)
Pt refused discharge vitals. "I don't want all of that. I'm ready to go. Come on, lets go."

## 2015-07-27 NOTE — ED Notes (Signed)
Patient called x 3 with no answer/ all areas of waiting room checked.
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
## 2017-07-26 ENCOUNTER — Emergency Department (HOSPITAL_COMMUNITY)
Admission: EM | Admit: 2017-07-26 | Discharge: 2017-07-27 | Disposition: A | Discharge status: Home / Self Care | Payer: Medicaid Other | Source: Home / Self Care | Attending: Emergency Medicine | Admitting: Emergency Medicine

## 2017-07-26 ENCOUNTER — Emergency Department (HOSPITAL_COMMUNITY): Payer: Medicaid Other

## 2017-07-26 ENCOUNTER — Emergency Department (HOSPITAL_COMMUNITY)
Admission: EM | Admit: 2017-07-26 | Discharge: 2017-07-26 | Disposition: A | Discharge status: Home / Self Care | Payer: Medicaid Other | Attending: Physician Assistant | Admitting: Physician Assistant

## 2017-07-26 ENCOUNTER — Encounter (HOSPITAL_COMMUNITY): Payer: Self-pay | Admitting: Emergency Medicine

## 2017-07-26 ENCOUNTER — Encounter (HOSPITAL_COMMUNITY): Payer: Self-pay | Admitting: Student

## 2017-07-26 DIAGNOSIS — R0789 Other chest pain: Secondary | ICD-10-CM | POA: Insufficient documentation

## 2017-07-26 DIAGNOSIS — F17210 Nicotine dependence, cigarettes, uncomplicated: Secondary | ICD-10-CM

## 2017-07-26 DIAGNOSIS — Y929 Unspecified place or not applicable: Secondary | ICD-10-CM | POA: Insufficient documentation

## 2017-07-26 DIAGNOSIS — Y939 Activity, unspecified: Secondary | ICD-10-CM | POA: Insufficient documentation

## 2017-07-26 DIAGNOSIS — M791 Myalgia, unspecified site: Secondary | ICD-10-CM

## 2017-07-26 DIAGNOSIS — Y999 Unspecified external cause status: Secondary | ICD-10-CM | POA: Insufficient documentation

## 2017-07-26 DIAGNOSIS — Z79899 Other long term (current) drug therapy: Secondary | ICD-10-CM

## 2017-07-26 DIAGNOSIS — R1012 Left upper quadrant pain: Secondary | ICD-10-CM | POA: Diagnosis not present

## 2017-07-26 DIAGNOSIS — S0240FA Zygomatic fracture, left side, initial encounter for closed fracture: Secondary | ICD-10-CM | POA: Diagnosis not present

## 2017-07-26 DIAGNOSIS — I252 Old myocardial infarction: Secondary | ICD-10-CM | POA: Insufficient documentation

## 2017-07-26 DIAGNOSIS — Z590 Homelessness: Secondary | ICD-10-CM

## 2017-07-26 DIAGNOSIS — S0990XA Unspecified injury of head, initial encounter: Secondary | ICD-10-CM | POA: Diagnosis present

## 2017-07-26 LAB — CBC WITH DIFFERENTIAL/PLATELET
Basophils Absolute: 0 10*3/uL (ref 0.0–0.1)
Basophils Relative: 0 %
EOS ABS: 0.1 10*3/uL (ref 0.0–0.7)
Eosinophils Relative: 1 %
HEMATOCRIT: 42.5 % (ref 39.0–52.0)
HEMOGLOBIN: 14 g/dL (ref 13.0–17.0)
LYMPHS PCT: 9 %
Lymphs Abs: 0.9 10*3/uL (ref 0.7–4.0)
MCH: 28.4 pg (ref 26.0–34.0)
MCHC: 32.9 g/dL (ref 30.0–36.0)
MCV: 86.2 fL (ref 78.0–100.0)
MONOS PCT: 8 %
Monocytes Absolute: 0.8 10*3/uL (ref 0.1–1.0)
NEUTROS ABS: 8.5 10*3/uL — AB (ref 1.7–7.7)
NEUTROS PCT: 82 %
Platelets: 265 10*3/uL (ref 150–400)
RBC: 4.93 MIL/uL (ref 4.22–5.81)
RDW: 14.5 % (ref 11.5–15.5)
WBC: 10.3 10*3/uL (ref 4.0–10.5)

## 2017-07-26 LAB — BASIC METABOLIC PANEL
ANION GAP: 9 (ref 5–15)
BUN: 12 mg/dL (ref 6–20)
CHLORIDE: 102 mmol/L (ref 101–111)
CO2: 24 mmol/L (ref 22–32)
CREATININE: 0.8 mg/dL (ref 0.61–1.24)
Calcium: 8.9 mg/dL (ref 8.9–10.3)
GFR calc Af Amer: >60 mL/min (ref >60)
GFR calc non Af Amer: >60 mL/min (ref >60)
Glucose, Bld: 114 mg/dL — ABNORMAL HIGH (ref 65–99)
POTASSIUM: 3.6 mmol/L (ref 3.5–5.1)
SODIUM: 135 mmol/L (ref 135–145)

## 2017-07-26 MED ORDER — IOHEXOL 300 MG/ML  SOLN
100.0000 mL | Freq: Once | INTRAMUSCULAR | Status: AC | PRN
  Administered 2017-07-26: 100 mL via INTRAVENOUS

## 2017-07-26 MED ORDER — NAPROXEN 500 MG PO TABS: 500.0000 mg | ORAL_TABLET | Freq: Two times a day (BID) | ORAL | 0 refills | Status: DC

## 2017-07-26 MED ORDER — OXYCODONE-ACETAMINOPHEN 5-325 MG PO TABS: 1.0000 | ORAL_TABLET | ORAL | 0 refills | Status: DC | PRN

## 2017-07-26 MED ORDER — OXYCODONE-ACETAMINOPHEN 5-325 MG PO TABS
1.0000 | ORAL_TABLET | Freq: Once | ORAL | Status: AC
  Administered 2017-07-26: 1 via ORAL
  Filled 2017-07-26: qty 1

## 2017-07-26 MED ORDER — FENTANYL CITRATE (PF) 100 MCG/2ML IJ SOLN
25.0000 ug | Freq: Once | INTRAMUSCULAR | Status: AC
  Administered 2017-07-26: 25 ug via INTRAVENOUS
  Filled 2017-07-26: qty 2

## 2017-07-26 NOTE — ED Notes (Signed)
ED Provider at bedside. 

## 2017-07-26 NOTE — ED Notes (Signed)
Called from lobby x1 

## 2017-07-26 NOTE — Discharge Instructions (Addendum)
Please fill prescription for percocet to help manage your pain.
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
## 2017-07-26 NOTE — ED Notes (Signed)
Patient transported to X-ray 
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
## 2017-07-27 NOTE — ED Notes (Signed)
2nd iv rt a-c removed also

## 2017-07-28 NOTE — Care Management Note (Signed)
Case Management Note  CM consulted for unknown reason.  Based on chart review CM believes it is for assistance with follow up.  CM will send a note to Lavinia Sharps, NP at the [facility] to be on the lookout for the pt.  CM noted he has been seen by a congregation nurse at the [facility] in the last several months.  Updated EDPAs via messages.  No further CM needs noted at this time.

## 2017-07-29 ENCOUNTER — Encounter (HOSPITAL_COMMUNITY): Payer: Self-pay | Admitting: Emergency Medicine

## 2017-07-29 ENCOUNTER — Emergency Department (HOSPITAL_COMMUNITY)
Admission: EM | Admit: 2017-07-29 | Discharge: 2017-07-30 | Disposition: A | Discharge status: Home / Self Care | Payer: Medicaid Other | Attending: Emergency Medicine | Admitting: Emergency Medicine

## 2017-07-29 DIAGNOSIS — R0789 Other chest pain: Secondary | ICD-10-CM

## 2017-07-29 DIAGNOSIS — R05 Cough: Secondary | ICD-10-CM | POA: Insufficient documentation

## 2017-07-29 DIAGNOSIS — Z5900 Homelessness unspecified: Secondary | ICD-10-CM

## 2017-07-29 DIAGNOSIS — Z79899 Other long term (current) drug therapy: Secondary | ICD-10-CM | POA: Insufficient documentation

## 2017-07-29 DIAGNOSIS — F17210 Nicotine dependence, cigarettes, uncomplicated: Secondary | ICD-10-CM | POA: Insufficient documentation

## 2017-07-29 DIAGNOSIS — R059 Cough, unspecified: Secondary | ICD-10-CM

## 2017-07-29 MED ORDER — ALBUTEROL SULFATE HFA 108 (90 BASE) MCG/ACT IN AERS
2.0000 | INHALATION_SPRAY | Freq: Once | RESPIRATORY_TRACT | Status: AC
  Administered 2017-07-29: 2 via RESPIRATORY_TRACT
  Filled 2017-07-29: qty 6.7
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
## 2017-07-29 NOTE — ED Provider Notes (Signed)
Patient placed in Quick Look pathway, seen and evaluated   Chief Complaint: L rib pain, dizziness  HPI:   67 year old male presents with dizziness and left-sided rib pain.  He is convinced that his ribs are fractured despite no evidence of this on imaging several days ago.  He also states he has been coughing up thick mucus and thinks he has a punctured lung.  He is also concerned about a wound over his left hip which was not addressed during his ED visit a couple days ago.  He is requesting a repeat x-ray  ROS: +rib pain, dizziness  -SOB  Physical Exam:   Gen: No distress  Neuro: Awake and Alert  Skin: Warm    Focused Exam: Cardiac: Regular rate and rhythm    Lungs: CTA. Tenderness and mild erythema over the lateral lower left ribs  Repeat rib xray ordered.  Initiation of care has begun. The patient has been counseled on the process, plan, and necessity for staying for the completion/evaluation, and the remainder of the medical screening examination    Bethel Born, PA-C 07/29/17 1907    Terrilee Files, MD 07/30/17 1253

## 2017-07-30 NOTE — ED Notes (Signed)
PT states understanding of care given, follow up care. PT ambulated from ED to car with a steady gait.  

## 2017-12-01 ENCOUNTER — Emergency Department (HOSPITAL_COMMUNITY)
Admission: EM | Admit: 2017-12-01 | Discharge: 2017-12-01 | Disposition: A | Discharge status: Home / Self Care | Payer: Medicare Other | Attending: Emergency Medicine | Admitting: Emergency Medicine

## 2017-12-01 ENCOUNTER — Emergency Department (HOSPITAL_COMMUNITY): Payer: Medicare Other

## 2017-12-01 ENCOUNTER — Encounter (HOSPITAL_COMMUNITY): Payer: Self-pay | Admitting: Neurology

## 2017-12-01 DIAGNOSIS — Y929 Unspecified place or not applicable: Secondary | ICD-10-CM | POA: Insufficient documentation

## 2017-12-01 DIAGNOSIS — S2242XA Multiple fractures of ribs, left side, initial encounter for closed fracture: Secondary | ICD-10-CM | POA: Insufficient documentation

## 2017-12-01 DIAGNOSIS — Y9389 Activity, other specified: Secondary | ICD-10-CM | POA: Insufficient documentation

## 2017-12-01 DIAGNOSIS — F209 Schizophrenia, unspecified: Secondary | ICD-10-CM | POA: Insufficient documentation

## 2017-12-01 DIAGNOSIS — Z79899 Other long term (current) drug therapy: Secondary | ICD-10-CM | POA: Diagnosis not present

## 2017-12-01 DIAGNOSIS — F17210 Nicotine dependence, cigarettes, uncomplicated: Secondary | ICD-10-CM | POA: Insufficient documentation

## 2017-12-01 DIAGNOSIS — Z590 Homelessness: Secondary | ICD-10-CM | POA: Insufficient documentation

## 2017-12-01 DIAGNOSIS — Y999 Unspecified external cause status: Secondary | ICD-10-CM | POA: Diagnosis not present

## 2017-12-01 DIAGNOSIS — S299XXA Unspecified injury of thorax, initial encounter: Secondary | ICD-10-CM | POA: Diagnosis present

## 2017-12-01 MED ORDER — LIDOCAINE 5 % EX PTCH
1.0000 | MEDICATED_PATCH | CUTANEOUS | Status: DC
  Administered 2017-12-01: 1 via TRANSDERMAL
  Filled 2017-12-01: qty 1

## 2017-12-01 NOTE — Discharge Instructions (Signed)
Get help right away if: You have a fever. You have difficulty breathing or shortness of breath. You develop a continual cough, or you cough up thick or bloody sputum. You feel sick to your stomach (nausea), throw up (vomit), or have abdominal pain. You have worsening pain not controlled with medicatio
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
## 2017-12-01 NOTE — ED Notes (Signed)
Pt given turkey sandwich and coke 

## 2017-12-03 ENCOUNTER — Encounter (HOSPITAL_COMMUNITY): Payer: Self-pay

## 2017-12-03 ENCOUNTER — Emergency Department (HOSPITAL_COMMUNITY): Payer: Medicare Other

## 2017-12-03 ENCOUNTER — Other Ambulatory Visit: Payer: Self-pay

## 2017-12-03 ENCOUNTER — Emergency Department (HOSPITAL_COMMUNITY)
Admission: EM | Admit: 2017-12-03 | Discharge: 2017-12-03 | Disposition: A | Discharge status: Home / Self Care | Payer: Medicare Other | Attending: Emergency Medicine | Admitting: Emergency Medicine

## 2017-12-03 DIAGNOSIS — I252 Old myocardial infarction: Secondary | ICD-10-CM | POA: Insufficient documentation

## 2017-12-03 DIAGNOSIS — R0781 Pleurodynia: Secondary | ICD-10-CM

## 2017-12-03 DIAGNOSIS — G8911 Acute pain due to trauma: Secondary | ICD-10-CM | POA: Diagnosis not present

## 2017-12-03 DIAGNOSIS — F17210 Nicotine dependence, cigarettes, uncomplicated: Secondary | ICD-10-CM | POA: Diagnosis not present

## 2017-12-03 DIAGNOSIS — F209 Schizophrenia, unspecified: Secondary | ICD-10-CM | POA: Diagnosis not present

## 2017-12-03 DIAGNOSIS — R0789 Other chest pain: Secondary | ICD-10-CM | POA: Diagnosis present

## 2017-12-03 DIAGNOSIS — Z5900 Homelessness unspecified: Secondary | ICD-10-CM

## 2017-12-03 LAB — CBC WITH DIFFERENTIAL/PLATELET
Basophils Absolute: 0 10*3/uL (ref 0.0–0.1)
Basophils Relative: 1 %
Eosinophils Absolute: 0.2 10*3/uL (ref 0.0–0.7)
Eosinophils Relative: 5 %
HEMATOCRIT: 43.7 % (ref 39.0–52.0)
HEMOGLOBIN: 14.4 g/dL (ref 13.0–17.0)
LYMPHS PCT: 33 %
Lymphs Abs: 1.4 10*3/uL (ref 0.7–4.0)
MCH: 28.9 pg (ref 26.0–34.0)
MCHC: 33 g/dL (ref 30.0–36.0)
MCV: 87.8 fL (ref 78.0–100.0)
MONO ABS: 0.3 10*3/uL (ref 0.1–1.0)
Monocytes Relative: 8 %
NEUTROS ABS: 2.3 10*3/uL (ref 1.7–7.7)
NEUTROS PCT: 53 %
Platelets: 362 10*3/uL (ref 150–400)
RBC: 4.98 MIL/uL (ref 4.22–5.81)
RDW: 15 % (ref 11.5–15.5)
WBC: 4.2 10*3/uL (ref 4.0–10.5)

## 2017-12-03 LAB — BASIC METABOLIC PANEL
ANION GAP: 10 (ref 5–15)
BUN: 12 mg/dL (ref 8–23)
CO2: 29 mmol/L (ref 22–32)
Calcium: 9.4 mg/dL (ref 8.9–10.3)
Chloride: 104 mmol/L (ref 98–111)
Creatinine, Ser: 0.66 mg/dL (ref 0.61–1.24)
GFR calc non Af Amer: >60 mL/min (ref >60)
GLUCOSE: 111 mg/dL — AB (ref 70–99)
POTASSIUM: 3.9 mmol/L (ref 3.5–5.1)
Sodium: 143 mmol/L (ref 135–145)

## 2017-12-03 LAB — POCT I-STAT TROPONIN I: TROPONIN I, POC: 0 ng/mL (ref 0.00–0.08)

## 2017-12-03 LAB — ETHANOL

## 2017-12-03 MED ORDER — LIDOCAINE 5 % EX PTCH
1.0000 | MEDICATED_PATCH | CUTANEOUS | Status: DC
  Administered 2017-12-03: 1 via TRANSDERMAL
  Filled 2017-12-03: qty 1

## 2017-12-03 NOTE — ED Provider Notes (Signed)
MSE was initiated and I personally evaluated the patient and placed orders (if any) at  5:12 PM on December 03, 2017.  The patient appears stable so that the remainder of the MSE may be completed by another provider.  Patient placed in Quick Look pathway, seen and evaluated   Chief Complaint: dizziness, L sided chest pain  HPI:   67yo M with a past medical history of prior MI, seizures, schizophrenia presents to ED for evaluation of 2-day history of dizziness and left-sided lower chest pain.  Patient initially told EMS that he was complaining of dizziness.  When he got to the ED he denied dizziness and states that he wanted to just "find a place to sleep."  I asked him repeatedly whether he was just here to find a place to sleep but he repeatedly told me that he was having dizziness and pain.  Denies any shortness of breath, cough, headache, vision changes.  ROS: Chest pain  Physical Exam:   Gen: No distress  Neuro: Awake and Alert  Skin: Warm    Focused Exam: RRR. Lungs CTAB. PERRL. No facial asymmetry noted.   Initiation of care has begun. The patient has been counseled on the process, plan, and necessity for staying for the completion/evaluation, and the remainder of the medical screening examination    Dietrich PatesKhatri, Zenon Leaf, PA-C 12/03/17 1723    Terrilee FilesButler, Michael C, MD 12/04/17 [phone]

## 2017-12-03 NOTE — Progress Notes (Signed)
CSW facilitated pt's admission into an [facility] and pt had to get there within a 20 minute window.  Pt had difficulty ambulating due to rib pain and CSW provided a taxi voucher.  CSW educated pt that f pt returned all the CSW had to offer was a Production manager.  CSW met with pt to offer resources, initally pt refused but as session progressed, pt accepted meeting schedules for inpatient/outpatient SA Tx resources.  CSW provided education to the pt as to the efficacy of 12-step programs for community support for those needing support in addition to or other than inpatient/outpatient treatment. Pt appreciated CSW's efforts and thanked the CSW.  Pt D/C'd via taxi.  Please reconsult if future social work needs arise.  CSW signing off, as social work intervention is no longer needed.  Raymond Miles. Raymond Neises, LCSW, LCAS, CSI Clinical Social Worker Ph: [phone]

## 2017-12-03 NOTE — ED Triage Notes (Signed)
Pt BIBA. Pt is homeless. Pt told EMS he was dizzy. However, upon talking with pt, he states he just wants somewhere to sleep. Pt denies dizziness ever.

## 2017-12-03 NOTE — ED Notes (Signed)
Pt states that he is homeless and just needs to rest, pt states he just wants some sleep ya"ll

## 2017-12-03 NOTE — ED Notes (Signed)
Patient refused lab draw @ this time. 

## 2017-12-03 NOTE — Discharge Instructions (Signed)
It was my pleasure taking care of you today!  ° °Return to ER for new or worsening symptoms, any additional concerns.  °
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
## 2017-12-03 NOTE — ED Notes (Signed)
Bed: [facility] Expected date:  Expected time:  Means of arrival:  Comments: 

## 2017-12-04 ENCOUNTER — Other Ambulatory Visit: Payer: Self-pay

## 2017-12-04 ENCOUNTER — Encounter (HOSPITAL_COMMUNITY): Payer: Self-pay

## 2017-12-04 ENCOUNTER — Emergency Department (HOSPITAL_COMMUNITY)
Admission: EM | Admit: 2017-12-04 | Discharge: 2017-12-04 | Disposition: A | Discharge status: Home / Self Care | Payer: Medicare Other | Attending: Emergency Medicine | Admitting: Emergency Medicine

## 2017-12-04 DIAGNOSIS — F17210 Nicotine dependence, cigarettes, uncomplicated: Secondary | ICD-10-CM | POA: Diagnosis not present

## 2017-12-04 DIAGNOSIS — R45850 Homicidal ideations: Secondary | ICD-10-CM | POA: Diagnosis not present

## 2017-12-04 DIAGNOSIS — F209 Schizophrenia, unspecified: Secondary | ICD-10-CM | POA: Insufficient documentation

## 2017-12-04 DIAGNOSIS — Z79899 Other long term (current) drug therapy: Secondary | ICD-10-CM | POA: Insufficient documentation

## 2017-12-04 DIAGNOSIS — F39 Unspecified mood [affective] disorder: Secondary | ICD-10-CM

## 2017-12-04 LAB — COMPREHENSIVE METABOLIC PANEL
ALBUMIN: 3.2 g/dL — AB (ref 3.5–5.0)
ALT: 24 U/L (ref 0–44)
AST: 23 U/L (ref 15–41)
Alkaline Phosphatase: 67 U/L (ref 38–126)
Anion gap: 10 (ref 5–15)
BUN: 10 mg/dL (ref 8–23)
CHLORIDE: 102 mmol/L (ref 98–111)
CO2: 25 mmol/L (ref 22–32)
CREATININE: 0.66 mg/dL (ref 0.61–1.24)
Calcium: 9 mg/dL (ref 8.9–10.3)
GFR calc Af Amer: >60 mL/min (ref >60)
GFR calc non Af Amer: >60 mL/min (ref >60)
GLUCOSE: 153 mg/dL — AB (ref 70–99)
Potassium: 3.8 mmol/L (ref 3.5–5.1)
SODIUM: 137 mmol/L (ref 135–145)
Total Bilirubin: 0.3 mg/dL (ref 0.3–1.2)
Total Protein: 6.7 g/dL (ref 6.5–8.1)

## 2017-12-04 LAB — CBC
HEMATOCRIT: 44 % (ref 39.0–52.0)
HEMOGLOBIN: 13.7 g/dL (ref 13.0–17.0)
MCH: 28 pg (ref 26.0–34.0)
MCHC: 31.1 g/dL (ref 30.0–36.0)
MCV: 90 fL (ref 78.0–100.0)
Platelets: 371 10*3/uL (ref 150–400)
RBC: 4.89 MIL/uL (ref 4.22–5.81)
RDW: 14.9 % (ref 11.5–15.5)
WBC: 5.5 10*3/uL (ref 4.0–10.5)

## 2017-12-04 LAB — SALICYLATE LEVEL: Salicylate Lvl: <7 mg/dL (ref 2.8–30.0)

## 2017-12-04 LAB — ETHANOL: Alcohol, Ethyl (B): <10 mg/dL (ref <10)

## 2017-12-04 LAB — ACETAMINOPHEN LEVEL: Acetaminophen (Tylenol), Serum: <10 ug/mL — ABNORMAL LOW (ref 10–30)

## 2017-12-04 MED ORDER — ONDANSETRON HCL 4 MG PO TABS: 4.0000 mg | ORAL_TABLET | Freq: Three times a day (TID) | ORAL | Status: DC | PRN

## 2017-12-04 MED ORDER — ACETAMINOPHEN 325 MG PO TABS: 650.0000 mg | ORAL_TABLET | ORAL | Status: DC | PRN

## 2017-12-04 MED ORDER — ZOLPIDEM TARTRATE 5 MG PO TABS: 5.0000 mg | ORAL_TABLET | Freq: Every evening | ORAL | Status: DC | PRN
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
## 2017-12-04 NOTE — ED Notes (Addendum)
Belongings returned to pt at this time. 3 bags.

## 2017-12-04 NOTE — ED Provider Notes (Signed)
Patient visit shared. Patient endorsed HI earlier today and has been evaluated by psych. He is been cleared from a psychiatric standpoint. On assessment in the department he denies any SI/HI. He states he wants a place to stay. Offered patient resources for shelters. Discussed outpatient follow-up and return precautions.   Tilden Fossaees, Apryl Brymer, MD 12/05/17 [phone]

## 2017-12-04 NOTE — ED Triage Notes (Signed)
[facility]. Pt states that he is intoxicated, hungry, having pain in his broken ribs, and feeling like hurting himself or somebody.
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
## 2017-12-04 NOTE — ED Notes (Signed)
Pt belongings are in locker 4  
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
## 2017-12-04 NOTE — BH Assessment (Signed)
[facility] to have the NP contact TTS at [phone] to review pt.
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
## 2017-12-04 NOTE — ED Notes (Signed)
Patient verbalizes understanding of discharge instructions. Opportunity for questioning and answers were provided. Armband removed by staff, pt discharged from ED ambulatory.   

## 2017-12-04 NOTE — BH Assessment (Signed)
TTS complete.  Waiting for disposition from NP.

## 2018-02-27 ENCOUNTER — Emergency Department (HOSPITAL_COMMUNITY)
Admission: EM | Admit: 2018-02-27 | Discharge: 2018-02-27 | Disposition: A | Discharge status: Home / Self Care | Payer: Medicare Other | Attending: Emergency Medicine | Admitting: Emergency Medicine

## 2018-02-27 ENCOUNTER — Other Ambulatory Visit: Payer: Self-pay

## 2018-02-27 ENCOUNTER — Emergency Department (HOSPITAL_COMMUNITY): Payer: Medicare Other

## 2018-02-27 DIAGNOSIS — W19XXXA Unspecified fall, initial encounter: Secondary | ICD-10-CM | POA: Insufficient documentation

## 2018-02-27 DIAGNOSIS — F17210 Nicotine dependence, cigarettes, uncomplicated: Secondary | ICD-10-CM | POA: Insufficient documentation

## 2018-02-27 DIAGNOSIS — Y929 Unspecified place or not applicable: Secondary | ICD-10-CM | POA: Diagnosis not present

## 2018-02-27 DIAGNOSIS — I252 Old myocardial infarction: Secondary | ICD-10-CM | POA: Diagnosis not present

## 2018-02-27 DIAGNOSIS — Y998 Other external cause status: Secondary | ICD-10-CM | POA: Diagnosis not present

## 2018-02-27 DIAGNOSIS — Z5900 Homelessness unspecified: Secondary | ICD-10-CM

## 2018-02-27 DIAGNOSIS — S9032XA Contusion of left foot, initial encounter: Secondary | ICD-10-CM | POA: Insufficient documentation

## 2018-02-27 DIAGNOSIS — R42 Dizziness and giddiness: Secondary | ICD-10-CM | POA: Insufficient documentation

## 2018-02-27 DIAGNOSIS — S99922A Unspecified injury of left foot, initial encounter: Secondary | ICD-10-CM | POA: Diagnosis present

## 2018-02-27 DIAGNOSIS — Z79899 Other long term (current) drug therapy: Secondary | ICD-10-CM | POA: Insufficient documentation

## 2018-02-27 DIAGNOSIS — Y939 Activity, unspecified: Secondary | ICD-10-CM | POA: Insufficient documentation

## 2018-02-27 LAB — I-STAT CHEM 8, ED
BUN: 8 mg/dL (ref 8–23)
CALCIUM ION: 1.1 mmol/L — AB (ref 1.15–1.40)
CHLORIDE: 101 mmol/L (ref 98–111)
CREATININE: 0.6 mg/dL — AB (ref 0.61–1.24)
GLUCOSE: 122 mg/dL — AB (ref 70–99)
HCT: 45 % (ref 39.0–52.0)
Hemoglobin: 15.3 g/dL (ref 13.0–17.0)
Potassium: 3.4 mmol/L — ABNORMAL LOW (ref 3.5–5.1)
Sodium: 138 mmol/L (ref 135–145)
TCO2: 27 mmol/L (ref 22–32)

## 2018-02-27 NOTE — ED Provider Notes (Signed)
TIME SEEN: 4:19 AM  CHIEF COMPLAINT: Homelessness, left foot pain  HPI: Patient is a 67 year old male with history of schizophrenia, MI, homelessness who presents to the emergency department with left foot and ankle pain.  Reports he fell but cannot give me any further details of why he fell.  Did not injure his head.  No neck or back pain.  Has been able to ambulate.  Reports he has been wearing wet shoes and socks.  Patient also complaining of feeling lightheaded for the past 2 to 3 days.  No vertigo.  No fevers, vomiting, diarrhea, chest pain or shortness of breath.  Denies any recent drug or alcohol use.  ROS: See HPI Constitutional: no fever  Eyes: no drainage  ENT: no runny nose   Cardiovascular:  no chest pain  Resp: no SOB  GI: no vomiting GU: no dysuria Integumentary: no rash  Allergy: no hives  Musculoskeletal: no leg swelling  Neurological: no slurred speech ROS otherwise negative  PAST MEDICAL HISTORY/PAST SURGICAL HISTORY:  Past Medical History:  Diagnosis Date  . MI (myocardial infarction) (HCC)   . Schizophrenia (HCC)   . Seizures (HCC)     MEDICATIONS:  Prior to Admission medications   Medication Sig Start Date End Date Taking? Authorizing Provider  benztropine (COGENTIN) 0.5 MG tablet Take 0.5 mg by mouth 2 (two) times daily.    [provider]  haloperidol (HALDOL) 5 MG tablet Take 5 mg by mouth at bedtime.    [provider]  haloperidol decanoate (HALDOL DECANOATE) 50 MG/ML injection Inject 50 mg into the muscle every 28 (twenty-eight) days.    [provider]  HYDROcodone-acetaminophen (NORCO/VICODIN) 5-325 MG tablet Take 1-2 tablets by mouth every 4 (four) hours as needed for moderate pain. Patient not taking: Reported on 12/04/2017 07/27/15   Fayrene Helperran, Bowie, PA-C  naproxen (NAPROSYN) 500 MG tablet Take 1 tablet (500 mg total) by mouth 2 (two) times daily. Patient not taking: Reported on 12/04/2017 07/26/17   Petrucelli, Pleas KochSamantha R,  PA-C  oxyCODONE-acetaminophen (PERCOCET/ROXICET) 5-325 MG tablet Take 1 tablet by mouth every 4 (four) hours as needed for severe pain. Patient not taking: Reported on 12/04/2017 07/26/17   Kellie ShropshireShrosbree, Emily J, PA-C    ALLERGIES:  Allergies  Allergen Reactions  . Mellaril [Thioridazine] Other (See Comments)    Patient fell to his knees and passed out    SOCIAL HISTORY:  Social History   Tobacco Use  . Smoking status: Current Every Day Smoker    Packs/day: 1.00    Types: Cigarettes  . Smokeless tobacco: Never Used  Substance Use Topics  . Alcohol use: Yes    Comment: periodically    FAMILY HISTORY: No family history on file.  EXAM: BP (!) 113/93   Pulse 79   Temp 97.6 F (36.4 C) (Oral)   Resp 18   Ht 6\' 1"  (1.854 m)   Wt 68 kg   SpO2 98%   BMI 19.79 kg/m  CONSTITUTIONAL: Alert and oriented and responds appropriately to questions. Well-appearing; well-nourished HEAD: Normocephalic EYES: Conjunctivae clear, pupils appear equal, EOMI ENT: normal nose; moist mucous membranes NECK: Supple, no meningismus, no nuchal rigidity, no LAD  CARD: RRR; S1 and S2 appreciated; no murmurs, no clicks, no rubs, no gallops RESP: Normal chest excursion without splinting or tachypnea; breath sounds clear and equal bilaterally; no wheezes, no rhonchi, no rales, no hypoxia or respiratory distress, speaking full sentences ABD/GI: Normal bowel sounds; non-distended; soft, non-tender, no rebound, no guarding, no  peritoneal signs, no hepatosplenomegaly BACK:  The back appears normal and is non-tender to palpation, there is no CVA tenderness EXT: Patient has some tenderness over the dorsal left foot and medial and lateral left ankle with ecchymosis.  No significant swelling.  Compartments are soft in the left leg.  2+ DP pulses bilaterally.  Normal ROM in all joints; no bony deformity; no edema; normal capillary refill; no cyanosis, no calf tenderness or swelling    SKIN: Normal color for age and  race; warm; no rash NEURO: Moves all extremities equally, normal speech, cranial nerves II through XII intact, ambulates without difficulty, reports normal sensation diffusely PSYCH: The patient's mood and manner are appropriate. Grooming and personal hygiene are appropriate.  MEDICAL DECISION MAKING: Patient here with left ankle and foot injury.  Will obtain x-rays.  He declines Tylenol or ibuprofen for pain.  He is able to ambulate here.  Also reports feeling lightheaded for a few days.  States he has not had much to eat or drink.  Provided with apple juice and orange juice in the emergency department.  Will check basic labs to ensure no significant anemia, electrolyte abnormality.  Will also obtain EKG to evaluate for ischemic abnormality, arrhythmia.  He denies any chest pain or shortness of breath.  No focal neurologic deficits on exam to suggest stroke.  ED PROGRESS: Patient's x-ray shows no fracture dislocation.  Labs reassuring with normal electrolytes, hemoglobin, glucose.  I feel he is safe to be discharged.   At this time, I do not feel there is any life-threatening condition present. I have reviewed and discussed all results (EKG, imaging, lab, urine as appropriate) and exam findings with patient/family. I have reviewed nursing notes and appropriate previous records.  I feel the patient is safe to be discharged home without further emergent workup and can continue workup as an outpatient as needed. Discussed usual and customary return precautions. Patient/family verbalize understanding and are comfortable with this plan.  Outpatient follow-up has been provided as needed. All questions have been answered.      EKG Interpretation  Date/Time:  Friday February 27 2018 04:40:34 EST Ventricular Rate:  71 PR Interval:    QRS Duration: 86 QT Interval:  426 QTC Calculation: 463 R Axis:   73 Text Interpretation:  Sinus rhythm Probable anteroseptal infarct, recent Baseline wander in lead(s)  V6 No significant change since last tracing Confirmed by Ward, Baxter Hire [phone]) on 02/27/2018 4:46:31 AM          Ward, Layla Maw, DO 02/27/18 6578
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
## 2018-02-27 NOTE — ED Notes (Signed)
Patient  Refused lab work

## 2018-02-27 NOTE — ED Triage Notes (Signed)
Patient requested that EMS take him to a Shelter; EMS declined but stated that they could get [employer] to take him. Patient declined and states that he would rather go to the hospital. C/o foot pain and cold feet. Patient is homeless.

## 2018-02-27 NOTE — Discharge Instructions (Addendum)
You may alternate Tylenol 1000 mg every 6 hours as needed for pain and Ibuprofen 800 mg every 8 hours as needed for pain.  Please take Ibuprofen with food.   Your labs and x-rays today were normal.   Steps to find a Primary Care Provider (PCP):  Call [phone] or [phone] to access "[facility] Find a Doctor Service."  2.  You may also go on the [facility] website at [email or web address]  3.  [facility] [place] [facility] also frequently accepts new patients.  [facility]  [address] [place] [id number] [phone]  4.  There are also multiple [facility] practices throughout the [place] that are frequently accepting new patients. You may find a clinic that is close to your home and contact them.  [facility] [email or web address] [phone]  [facility] [email or web address]  [facility] [email or web address] [phone]  [email or web address] [phone]  5.  Local Health Departments also can provide primary care services.  [facility]  [address] [place] [id number] [phone]  [facility] [address] [place] [address] [phone]  [facility] [address] [place] [address]  [place] [id number] [phone]

## 2018-05-22 ENCOUNTER — Emergency Department (HOSPITAL_COMMUNITY): Payer: Medicare Other

## 2018-05-22 ENCOUNTER — Encounter (HOSPITAL_COMMUNITY): Payer: Self-pay | Admitting: Emergency Medicine

## 2018-05-22 ENCOUNTER — Emergency Department (HOSPITAL_COMMUNITY)
Admission: EM | Admit: 2018-05-22 | Discharge: 2018-05-22 | Disposition: A | Discharge status: Home / Self Care | Payer: Medicare Other | Attending: Emergency Medicine | Admitting: Emergency Medicine

## 2018-05-22 ENCOUNTER — Other Ambulatory Visit: Payer: Self-pay

## 2018-05-22 DIAGNOSIS — I82C11 Acute embolism and thrombosis of right internal jugular vein: Secondary | ICD-10-CM | POA: Diagnosis not present

## 2018-05-22 DIAGNOSIS — M25551 Pain in right hip: Secondary | ICD-10-CM | POA: Diagnosis not present

## 2018-05-22 DIAGNOSIS — Z79899 Other long term (current) drug therapy: Secondary | ICD-10-CM | POA: Insufficient documentation

## 2018-05-22 DIAGNOSIS — F17210 Nicotine dependence, cigarettes, uncomplicated: Secondary | ICD-10-CM | POA: Insufficient documentation

## 2018-05-22 DIAGNOSIS — J039 Acute tonsillitis, unspecified: Secondary | ICD-10-CM | POA: Diagnosis not present

## 2018-05-22 DIAGNOSIS — W19XXXA Unspecified fall, initial encounter: Secondary | ICD-10-CM | POA: Diagnosis not present

## 2018-05-22 DIAGNOSIS — J029 Acute pharyngitis, unspecified: Secondary | ICD-10-CM | POA: Diagnosis present

## 2018-05-22 LAB — CBC WITH DIFFERENTIAL/PLATELET
Abs Immature Granulocytes: 0.01 10*3/uL (ref 0.00–0.07)
Basophils Absolute: 0 10*3/uL (ref 0.0–0.1)
Basophils Relative: 1 %
Eosinophils Absolute: 0.2 10*3/uL (ref 0.0–0.5)
Eosinophils Relative: 3 %
HCT: 45.7 % (ref 39.0–52.0)
Hemoglobin: 14.4 g/dL (ref 13.0–17.0)
Immature Granulocytes: 0 %
Lymphocytes Relative: 12 %
Lymphs Abs: 0.9 10*3/uL (ref 0.7–4.0)
MCH: 27.6 pg (ref 26.0–34.0)
MCHC: 31.5 g/dL (ref 30.0–36.0)
MCV: 87.7 fL (ref 80.0–100.0)
Monocytes Absolute: 0.5 10*3/uL (ref 0.1–1.0)
Monocytes Relative: 8 %
NEUTROS ABS: 5.4 10*3/uL (ref 1.7–7.7)
Neutrophils Relative %: 76 %
Platelets: 296 10*3/uL (ref 150–400)
RBC: 5.21 MIL/uL (ref 4.22–5.81)
RDW: 14.8 % (ref 11.5–15.5)
WBC: 7.1 10*3/uL (ref 4.0–10.5)
nRBC: 0 % (ref 0.0–0.2)

## 2018-05-22 LAB — BASIC METABOLIC PANEL
Anion gap: 9 (ref 5–15)
BUN: 13 mg/dL (ref 8–23)
CO2: 24 mmol/L (ref 22–32)
Calcium: 9.1 mg/dL (ref 8.9–10.3)
Chloride: 101 mmol/L (ref 98–111)
Creatinine, Ser: 0.75 mg/dL (ref 0.61–1.24)
GFR calc Af Amer: >60 mL/min (ref >60)
GFR calc non Af Amer: >60 mL/min (ref >60)
Glucose, Bld: 93 mg/dL (ref 70–99)
POTASSIUM: 3.9 mmol/L (ref 3.5–5.1)
Sodium: 134 mmol/L — ABNORMAL LOW (ref 135–145)

## 2018-05-22 LAB — GROUP A STREP BY PCR: Group A Strep by PCR: NOT DETECTED

## 2018-05-22 MED ORDER — CLINDAMYCIN HCL 300 MG PO CAPS: 300.0000 mg | ORAL_CAPSULE | Freq: Four times a day (QID) | ORAL | 0 refills | Status: AC

## 2018-05-22 MED ORDER — DEXAMETHASONE SODIUM PHOSPHATE 10 MG/ML IJ SOLN
10.0000 mg | Freq: Once | INTRAMUSCULAR | Status: AC
  Administered 2018-05-22: 10 mg via INTRAVENOUS
  Filled 2018-05-22: qty 1

## 2018-05-22 MED ORDER — CLINDAMYCIN PHOSPHATE 600 MG/50ML IV SOLN
600.0000 mg | Freq: Once | INTRAVENOUS | Status: AC
  Administered 2018-05-22: 600 mg via INTRAVENOUS
  Filled 2018-05-22: qty 50

## 2018-05-22 MED ORDER — IOHEXOL 300 MG/ML  SOLN
75.0000 mL | Freq: Once | INTRAMUSCULAR | Status: AC | PRN
  Administered 2018-05-22: 75 mL via INTRAVENOUS

## 2018-05-22 MED ORDER — SODIUM CHLORIDE 0.9 % IV BOLUS
1000.0000 mL | Freq: Once | INTRAVENOUS | Status: AC
  Administered 2018-05-22: 1000 mL via INTRAVENOUS

## 2018-05-22 NOTE — Discharge Instructions (Signed)
Follow-up with [place] and wellness for occlusion of your right neck pain.  Follow-up with [facility] to recheck your throat.  Return to the emergency room at anytime for any worsening or concerning symptoms.  Your tonsil infection could develop into an abscess which may need to be drained.  Take clindamycin as prescribed and complete the full course. Take Motrin and Tylenol as needed as directed for pain.

## 2018-05-22 NOTE — ED Triage Notes (Signed)
Onset 2 days ago developed sore throat pain worsening overtime. Currently 10/10 sore airway intact bilateral equal chest rise and fall.
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
## 2018-05-22 NOTE — ED Notes (Signed)
Blood cultures not ordered at this time.

## 2018-05-22 NOTE — ED Notes (Signed)
Discharge instructions (including medications) discussed with and copy provided to patient/caregiver.  Pt verbalizes understanding of d/c instructions.  Pt given food.  D/C from ED Pt unable to sign, no signature pad available.

## 2018-05-22 NOTE — ED Notes (Signed)
Returned from radiology. 

## 2018-05-22 NOTE — ED Notes (Signed)
Transported to radiology 

## 2018-05-22 NOTE — ED Notes (Signed)
Patient transported to CT 

## 2018-06-24 ENCOUNTER — Emergency Department (HOSPITAL_COMMUNITY)
Admission: EM | Admit: 2018-06-24 | Discharge: 2018-06-24 | Disposition: A | Discharge status: Home / Self Care | Payer: Medicare Other | Attending: Emergency Medicine | Admitting: Emergency Medicine

## 2018-06-24 ENCOUNTER — Other Ambulatory Visit: Payer: Self-pay

## 2018-06-24 DIAGNOSIS — Z79899 Other long term (current) drug therapy: Secondary | ICD-10-CM | POA: Insufficient documentation

## 2018-06-24 DIAGNOSIS — F17210 Nicotine dependence, cigarettes, uncomplicated: Secondary | ICD-10-CM | POA: Diagnosis not present

## 2018-06-24 DIAGNOSIS — Y929 Unspecified place or not applicable: Secondary | ICD-10-CM | POA: Diagnosis not present

## 2018-06-24 DIAGNOSIS — S51812A Laceration without foreign body of left forearm, initial encounter: Secondary | ICD-10-CM | POA: Insufficient documentation

## 2018-06-24 DIAGNOSIS — T148XXA Other injury of unspecified body region, initial encounter: Secondary | ICD-10-CM

## 2018-06-24 DIAGNOSIS — Y999 Unspecified external cause status: Secondary | ICD-10-CM | POA: Insufficient documentation

## 2018-06-24 DIAGNOSIS — Z23 Encounter for immunization: Secondary | ICD-10-CM | POA: Insufficient documentation

## 2018-06-24 DIAGNOSIS — Y939 Activity, unspecified: Secondary | ICD-10-CM | POA: Diagnosis not present

## 2018-06-24 MED ORDER — CEPHALEXIN 500 MG PO CAPS: 500.0000 mg | ORAL_CAPSULE | Freq: Two times a day (BID) | ORAL | 0 refills | Status: AC

## 2018-06-24 MED ORDER — LIDOCAINE-EPINEPHRINE (PF) 2 %-1:200000 IJ SOLN
20.0000 mL | Freq: Once | INTRAMUSCULAR | Status: AC
  Administered 2018-06-24: 20 mL via INTRADERMAL

## 2018-06-24 MED ORDER — LIDOCAINE-EPINEPHRINE (PF) 2 %-1:200000 IJ SOLN
INTRAMUSCULAR | Status: AC
  Administered 2018-06-24: 20 mL via INTRADERMAL
  Filled 2018-06-24: qty 20

## 2018-06-24 MED ORDER — TETANUS-DIPHTH-ACELL PERTUSSIS 5-2.5-18.5 LF-MCG/0.5 IM SUSP
0.5000 mL | Freq: Once | INTRAMUSCULAR | Status: AC
  Administered 2018-06-24: 0.5 mL via INTRAMUSCULAR
  Filled 2018-06-24: qty 0.5
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
## 2018-06-24 NOTE — ED Notes (Signed)
Patient verbalizes understanding of discharge instructions. Opportunity for questioning and answers were provided. Armband removed by staff, pt discharged from ED ambulatory.   

## 2018-12-29 ENCOUNTER — Emergency Department (HOSPITAL_COMMUNITY)
Admission: EM | Admit: 2018-12-29 | Discharge: 2018-12-29 | Disposition: A | Discharge status: Home / Self Care | Payer: Medicare Other | Attending: Emergency Medicine | Admitting: Emergency Medicine

## 2018-12-29 DIAGNOSIS — M79671 Pain in right foot: Secondary | ICD-10-CM | POA: Insufficient documentation

## 2018-12-29 DIAGNOSIS — M79672 Pain in left foot: Secondary | ICD-10-CM | POA: Insufficient documentation

## 2018-12-29 DIAGNOSIS — F17210 Nicotine dependence, cigarettes, uncomplicated: Secondary | ICD-10-CM | POA: Diagnosis not present

## 2018-12-29 DIAGNOSIS — F1410 Cocaine abuse, uncomplicated: Secondary | ICD-10-CM | POA: Diagnosis not present

## 2018-12-29 DIAGNOSIS — R238 Other skin changes: Secondary | ICD-10-CM | POA: Insufficient documentation

## 2018-12-29 MED ORDER — BACITRACIN ZINC 500 UNIT/GM EX OINT
TOPICAL_OINTMENT | CUTANEOUS | Status: AC
  Filled 2018-12-29: qty 0.9
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
## 2018-12-29 NOTE — ED Provider Notes (Signed)
[facility] DEPT Provider Note   CSN: [id number] Arrival date & time: 12/29/18  6195     History   Chief Complaint Chief Complaint  Patient presents with  . Foot Pain    HPI Raymond Miles is a 68 y.o. male.     Raymond Miles is a 68 y.o. male with a history of schizophrenia, seizures and MI, currently homeless, who presents via EMS for blisters on his feet.  Patient was woken up by police and requested to be brought to the hospital for blisters over the toes.  Patient reports these have been present for a few weeks.  They are painful when he walks.  They are primarily present over the tops of his toes.  No surrounding redness, no fevers.  No nausea, vomiting or other systemic symptoms.  No wounds elsewhere.  Patient requesting somewhere to lay down but denies any other focal medical complaints.  Denies SI or HI.     Past Medical History:  Diagnosis Date  . MI (myocardial infarction) ([place])   . Schizophrenia ([place])   . Seizures ([place])     There are no active problems to display for this patient.   Past Surgical History:  Procedure Laterality Date  . 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 CARDIAC SURGERY          Home Medications    Prior to Admission medications   Medication Sig Start Date End Date Taking? Authorizing Provider  benztropine (COGENTIN) 0.5 MG tablet Take 0.5 mg by mouth 2 (two) times daily.    [provider]  haloperidol decanoate (HALDOL DECANOATE) 50 MG/ML injection Inject 50 mg into the muscle every 28 (twenty-eight) days.    [provider]    Family History No family history on file.  Social History Social History   Tobacco Use  . Smoking status: Current Every Day Smoker    Packs/day: 1.00    Types: Cigarettes  . Smokeless tobacco: Never Used  Substance Use Topics  . Alcohol use: Yes    Comment: periodically  . Drug use: Yes    Types: Cocaine    Comment: last time last week  9-19      Allergies   Mellaril [thioridazine]   Review of Systems Review of Systems  Constitutional: Negative for chills and fever.  Respiratory: Negative for cough and shortness of breath.   Cardiovascular: Negative for chest pain.  Gastrointestinal: Negative for abdominal pain.  Musculoskeletal: Negative for arthralgias and myalgias.  Skin: Positive for wound.  Neurological: Negative for weakness and numbness.  Psychiatric/Behavioral: Negative for confusion, hallucinations and suicidal ideas.     Physical Exam Updated Vital Signs BP (!) 122/91 (BP Location: Left Arm)   Pulse 82   Temp 98.2 F (36.8 C) (Oral)   Resp 16   SpO2 100%   Physical Exam Vitals signs and nursing note reviewed.  Constitutional:      General: He is not in acute distress.    Appearance: Normal appearance. He is well-developed and normal weight. He is not ill-appearing or diaphoretic.     Comments: Patient alert and oriented, homeless with disheveled appearance, no acute distress  HENT:     Head: Normocephalic and atraumatic.  Eyes:     General:        Right eye: No discharge.        Left eye: No discharge.  Pulmonary:     Effort: Pulmonary effort is normal. No respiratory  distress.  Musculoskeletal:     Comments: There are some blisters and redness over the tops of the toes no surrounding cellulitis, there is one area on the right foot that is scabbed over, feet are warm and well perfused with 2+ DP and PT pulses and good cap refill, normal sensation and strength.  No other wounds noted.  Skin:    General: Skin is warm and dry.  Neurological:     Mental Status: He is alert and oriented to person, place, and time.     Coordination: Coordination normal.  Psychiatric:        Mood and Affect: Mood normal.        Behavior: Behavior normal.      ED Treatments / Results  Labs (all labs ordered are listed, but only abnormal results are displayed) Labs Reviewed - No data to display  EKG  None  Radiology No results found.  Procedures Procedures (including critical care time)  Medications Ordered in ED Medications - No data to display   Initial Impression / Assessment and Plan / ED Course  I have reviewed the triage vital signs and the nursing notes.  Pertinent labs & imaging results that were available during my care of the patient were reviewed by me and considered in my medical decision making (see chart for details).  68 year old male currently homeless presents reporting blisters to both of his feet, he was woken up by police and requested to come to the emergency department.  Patient has small blisters in the area of redness over the tops of multiple toes, no areas with purulent drainage, no diffuse cellulitis, feet are warm and well-perfused, no necrotic appearing wounds or wounds over the bottom of the feet.  The blisters were dressed with bacitracin, dressing and clean socks.  Return precautions discussed.  Patient is alert and oriented, denies SI or HI and denies any other focal medical complaints.   At this time there does not appear to be any evidence of an acute emergency medical condition and the patient appears stable for discharge with appropriate outpatient follow up. Diagnosis was discussed with patient who verbalizes understanding and is agreeable to discharge.  Final Clinical Impressions(s) / ED Diagnoses   Final diagnoses:  Pain in both feet  Blisters of multiple sites    ED Discharge Orders    None       Dartha Lodge, [place] 12/29/18 3474    Derwood Kaplan, MD 12/29/18 [phone]

## 2018-12-29 NOTE — Discharge Instructions (Signed)
Keep wounds clean and dry, apply antibiotic ointment and bandages make sure you are wearing clean and dry socks to help present worsening of wounds.  If you develop increasing redness, pain, purulent drainage, or any other new or concerning symptoms return to the ED.

## 2018-12-30 ENCOUNTER — Encounter (HOSPITAL_COMMUNITY): Payer: Self-pay

## 2018-12-30 ENCOUNTER — Other Ambulatory Visit: Payer: Self-pay

## 2018-12-30 ENCOUNTER — Emergency Department (HOSPITAL_COMMUNITY)
Admission: EM | Admit: 2018-12-30 | Discharge: 2018-12-30 | Disposition: A | Discharge status: Home / Self Care | Payer: Medicare Other | Attending: Emergency Medicine | Admitting: Emergency Medicine

## 2018-12-30 DIAGNOSIS — I252 Old myocardial infarction: Secondary | ICD-10-CM | POA: Diagnosis not present

## 2018-12-30 DIAGNOSIS — M79671 Pain in right foot: Secondary | ICD-10-CM | POA: Diagnosis not present

## 2018-12-30 DIAGNOSIS — M79672 Pain in left foot: Secondary | ICD-10-CM | POA: Insufficient documentation

## 2018-12-30 DIAGNOSIS — F17210 Nicotine dependence, cigarettes, uncomplicated: Secondary | ICD-10-CM | POA: Diagnosis not present

## 2018-12-30 MED ORDER — ACETAMINOPHEN 500 MG PO TABS
1000.0000 mg | ORAL_TABLET | Freq: Once | ORAL | Status: DC
  Filled 2018-12-30: qty 2

## 2018-12-30 NOTE — ED Notes (Signed)
Pt refused tylenol, then asked for breakfast. This RN offered a sand-which and he refused, demanding a breakfast tray.

## 2018-12-30 NOTE — ED Notes (Signed)
I went to dry patient feet and put socks and shoes on and patient raise up and started stating that I did not have to be rough.  I was very gentle trying to clean between patient toes.  Nurse was in the room and witness what took place.

## 2018-12-30 NOTE — ED Notes (Signed)
Pt's feet placed in wash basin with saline/betadine mix to soak

## 2018-12-30 NOTE — ED Triage Notes (Signed)
Pt is homeless and complains of bilateral foot pain, he was here earlier yesterday for the same

## 2018-12-30 NOTE — ED Provider Notes (Signed)
[facility] DEPT Provider Note   CSN: [id number] Arrival date & time: 12/30/18  0214     History   Chief Complaint Chief Complaint  Patient presents with  . Foot Pain    HPI Raymond Miles is a 68 y.o. male.     The history is provided by the patient.  Foot Pain This is a chronic problem. The current episode started more than 1 week ago. The problem occurs constantly. The problem has not changed since onset.Pertinent negatives include no chest pain, no abdominal pain, no headaches and no shortness of breath. The symptoms are aggravated by walking. Nothing relieves the symptoms. He has tried nothing for the symptoms. The treatment provided no relief.  Patient is homeless and has been walking a lot and it has been wet out.  He has foot pain.  No f/c/r. No wounds.    Past Medical History:  Diagnosis Date  . MI (myocardial infarction) ([place])   . Schizophrenia ([place])   . Seizures ([place])     There are no active problems to display for this patient.   Past Surgical History:  Procedure Laterality Date  . 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 CARDIAC SURGERY          Home Medications    Prior to Admission medications   Medication Sig Start Date End Date Taking? Authorizing Provider  benztropine (COGENTIN) 0.5 MG tablet Take 0.5 mg by mouth 2 (two) times daily.    [provider]  haloperidol decanoate (HALDOL DECANOATE) 50 MG/ML injection Inject 50 mg into the muscle every 28 (twenty-eight) days.    [provider]    Family History History reviewed. No pertinent family history.  Social History Social History   Tobacco Use  . Smoking status: Current Every Day Smoker    Packs/day: 1.00    Types: Cigarettes  . Smokeless tobacco: Never Used  Substance Use Topics  . Alcohol use: Yes    Comment: periodically  . Drug use: Yes    Types: Cocaine    Comment: last time last week 9-19      Allergies    Mellaril [thioridazine]   Review of Systems Review of Systems  Constitutional: Negative for fever.  HENT: Negative for congestion.   Eyes: Negative for visual disturbance.  Respiratory: Negative for shortness of breath.   Cardiovascular: Negative for chest pain.  Gastrointestinal: Negative for abdominal pain.  Genitourinary: Negative for difficulty urinating.  Musculoskeletal: Positive for arthralgias.  Skin: Negative for wound.  Neurological: Negative for headaches.  Psychiatric/Behavioral: Negative for agitation.  All other systems reviewed and are negative.    Physical Exam Updated Vital Signs BP (!) 135/104 (BP Location: Left Arm)   Pulse 90   Temp (!) 97.5 F (36.4 C) (Oral)   Resp 16   SpO2 100%   Physical Exam Vitals signs and nursing note reviewed.  Constitutional:      General: He is not in acute distress.    Appearance: He is normal weight.  HENT:     Head: Normocephalic and atraumatic.     Nose: Nose normal.  Eyes:     Conjunctiva/sclera: Conjunctivae normal.     Pupils: Pupils are equal, round, and reactive to light.  Neck:     Musculoskeletal: Normal range of motion and neck supple.  Cardiovascular:     Rate and Rhythm: Normal rate and regular rhythm.     Pulses: Normal pulses.  Heart sounds: Normal heart sounds.  Pulmonary:     Effort: Pulmonary effort is normal.     Breath sounds: Normal breath sounds.  Abdominal:     General: Abdomen is flat. Bowel sounds are normal.     Tenderness: There is no abdominal tenderness. There is no guarding.  Musculoskeletal: Normal range of motion.        General: No swelling or tenderness.  Skin:    General: Skin is warm and dry.     Capillary Refill: Capillary refill takes less than 2 seconds.  Neurological:     General: No focal deficit present.     Mental Status: He is alert and oriented to person, place, and time.     Deep Tendon Reflexes: Reflexes normal.  Psychiatric:        Mood and Affect: Mood  normal.        Behavior: Behavior normal.      ED Treatments / Results  Labs (all labs ordered are listed, but only abnormal results are displayed) Labs Reviewed - No data to display  EKG None  Radiology No results found.  Procedures Procedures (including critical care time)  Medications Ordered in ED Medications - No data to display   Feet soaked, dry socks placed Tylenol provided along with post op shoes.  Stable for discharge.    Raymond Miles was evaluated in Emergency Department on 12/30/2018 for the symptoms described in the history of present illness. He was evaluated in the context of the global COVID-19 pandemic, which necessitated consideration that the patient might be at risk for infection with the SARS-CoV-2 virus that causes COVID-19. Institutional protocols and algorithms that pertain to the evaluation of patients at risk for COVID-19 are in a state of rapid change based on information released by regulatory bodies including the CDC and federal and state organizations. These policies and algorithms were followed during the patient's care in the ED.   Final Clinical Impressions(s) / ED Diagnoses   Return for weakness, numbness, changes in vision or speech, fevers >100.4 unrelieved by medication, shortness of breath, intractable vomiting, or diarrhea, abdominal pain, Inability to tolerate liquids or food, cough, altered mental status or any concerns. No signs of systemic illness or infection. The patient is nontoxic-appearing on exam and vital signs are within normal limits.   I have reviewed the triage vital signs and the nursing notes. Pertinent labs &imaging results that were available during my care of the patient were reviewed by me and considered in my medical decision making (see chart for details).  After history, exam, and medical workup I feel the patient has been appropriately medically screened and is safe for discharge home. Pertinent diagnoses were  discussed with the patient. Patient was given return precautions.   Tamika Shropshire, MD 12/30/18 [phone]

## 2018-12-30 NOTE — ED Notes (Signed)
This RN offered to dress pt's feet.  Pt refused demanding to speak to a supervisor.  Charge RN called to room.  Pt now putting on his socks for D/C.  Security escorting him out for D/C in a wheelchair.

## 2019-01-16 ENCOUNTER — Encounter (HOSPITAL_COMMUNITY): Payer: Self-pay | Admitting: Emergency Medicine

## 2019-01-16 ENCOUNTER — Emergency Department (HOSPITAL_COMMUNITY)
Admission: EM | Admit: 2019-01-16 | Discharge: 2019-01-16 | Disposition: A | Discharge status: Home / Self Care | Payer: Medicare Other | Attending: Emergency Medicine | Admitting: Emergency Medicine

## 2019-01-16 ENCOUNTER — Emergency Department (HOSPITAL_COMMUNITY): Payer: Medicare Other

## 2019-01-16 DIAGNOSIS — F17210 Nicotine dependence, cigarettes, uncomplicated: Secondary | ICD-10-CM | POA: Diagnosis not present

## 2019-01-16 DIAGNOSIS — F1410 Cocaine abuse, uncomplicated: Secondary | ICD-10-CM | POA: Insufficient documentation

## 2019-01-16 DIAGNOSIS — M25512 Pain in left shoulder: Secondary | ICD-10-CM | POA: Diagnosis present

## 2019-01-16 DIAGNOSIS — M79675 Pain in left toe(s): Secondary | ICD-10-CM

## 2019-01-16 NOTE — ED Notes (Signed)
Patient transported to X-ray 
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
## 2019-01-16 NOTE — ED Notes (Signed)
Pt verbalized understanding of d/c instructions and has no further questions, VSS, NAD. Pt given 2 new pair of socks so that he doesn't have to walk barefoot in his shoes. Encouraged to get new shoes and not to go without socks. Pt ambulatory without difficulty. Given sandwich bag and drink to go.

## 2019-01-16 NOTE — ED Notes (Signed)
Pt given turkey sandwich and gingerale. 

## 2019-01-16 NOTE — Discharge Instructions (Addendum)
No evidence of broken bones on your x-Tarini Carrier

## 2019-04-27 ENCOUNTER — Other Ambulatory Visit: Payer: Self-pay

## 2019-04-27 ENCOUNTER — Emergency Department (HOSPITAL_COMMUNITY)
Admission: EM | Admit: 2019-04-27 | Discharge: 2019-04-28 | Disposition: A | Discharge status: Home / Self Care | Payer: Medicare Other | Attending: Emergency Medicine | Admitting: Emergency Medicine

## 2019-04-27 ENCOUNTER — Encounter (HOSPITAL_COMMUNITY): Payer: Self-pay | Admitting: Emergency Medicine

## 2019-04-27 DIAGNOSIS — F17210 Nicotine dependence, cigarettes, uncomplicated: Secondary | ICD-10-CM | POA: Insufficient documentation

## 2019-04-27 DIAGNOSIS — Z79899 Other long term (current) drug therapy: Secondary | ICD-10-CM | POA: Diagnosis not present

## 2019-04-27 DIAGNOSIS — L84 Corns and callosities: Secondary | ICD-10-CM | POA: Insufficient documentation

## 2019-04-27 DIAGNOSIS — Z8669 Personal history of other diseases of the nervous system and sense organs: Secondary | ICD-10-CM | POA: Insufficient documentation

## 2019-04-27 DIAGNOSIS — F259 Schizoaffective disorder, unspecified: Secondary | ICD-10-CM | POA: Diagnosis not present

## 2019-04-27 DIAGNOSIS — I252 Old myocardial infarction: Secondary | ICD-10-CM | POA: Insufficient documentation

## 2019-04-27 DIAGNOSIS — M79671 Pain in right foot: Secondary | ICD-10-CM | POA: Diagnosis present

## 2019-04-27 DIAGNOSIS — Z590 Homelessness: Secondary | ICD-10-CM | POA: Diagnosis not present

## 2019-04-27 DIAGNOSIS — M79672 Pain in left foot: Secondary | ICD-10-CM | POA: Insufficient documentation

## 2019-04-27 NOTE — ED Triage Notes (Signed)
Patient [place] with EMS from street reports bilateral feet pain for several months due to calluses from walking , ambulatory , no fever or chills .

## 2019-04-28 NOTE — ED Notes (Addendum)
Pt becoming agitated after discharge. Pt has been cleared to go home and is refusing to leave. This nurse offered to clean his feet and then the pt became upset when he learned that we were discharging him.

## 2019-04-28 NOTE — ED Provider Notes (Signed)
[facility] EMERGENCY DEPARTMENT Provider Note   CSN: [id number] Arrival date & time: 04/27/19  2330     History Chief Complaint  Patient presents with  . Foot Pain    Raymond Miles is a 69 y.o. male.  The history is provided by the patient and medical records.    69 y.o. M with hx of schizophrenia, here with bilateral foot pain.  Patient is currently homeless and walks a great deal during the day.  He has calluses on his feet that are hurting him.  He states he wears sneakers but still has pain.  He reports seeing a foot specialist a few years ago.  No intervention tried PTA.  Past Medical History:  Diagnosis Date  . MI (myocardial infarction) ([place])   . Schizophrenia ([place])   . Seizures ([place])     There are no problems to display for this patient.   Past Surgical History:  Procedure Laterality Date  . 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 CARDIAC SURGERY         No family history on file.  Social History   Tobacco Use  . Smoking status: Current Every Day Smoker    Packs/day: 1.00    Types: Cigarettes  . Smokeless tobacco: Never Used  Substance Use Topics  . Alcohol use: Yes    Comment: periodically  . Drug use: Yes    Types: Cocaine    Comment: last time last week 9-19     Home Medications Prior to Admission medications   Medication Sig Start Date End Date Taking? Authorizing Provider  benztropine (COGENTIN) 0.5 MG tablet Take 0.5 mg by mouth 2 (two) times daily.    [provider]  haloperidol decanoate (HALDOL DECANOATE) 50 MG/ML injection Inject 50 mg into the muscle every 28 (twenty-eight) days.    [provider]    Allergies    Mellaril [thioridazine]  Review of Systems   Review of Systems  Musculoskeletal: Positive for arthralgias.  All other systems reviewed and are negative.   Physical Exam Updated Vital Signs BP 108/80 (BP Location: Left Arm)   Pulse 81   Temp 98.2 F (36.8 C) (Oral)    Resp 16   SpO2 98%   Physical Exam Vitals and nursing note reviewed.  Constitutional:      Appearance: He is well-developed.  HENT:     Head: Normocephalic and atraumatic.  Eyes:     Conjunctiva/sclera: Conjunctivae normal.     Pupils: Pupils are equal, round, and reactive to light.  Cardiovascular:     Rate and Rhythm: Normal rate and regular rhythm.     Heart sounds: Normal heart sounds.  Pulmonary:     Effort: Pulmonary effort is normal.     Breath sounds: Normal breath sounds.  Abdominal:     General: Bowel sounds are normal.     Palpations: Abdomen is soft.  Musculoskeletal:        General: Normal range of motion.     Cervical back: Normal range of motion.     Comments: Feet with calluses along soles of the feet, worse along great toes and balls of the feet; no open wounds or blisters noted; toenails are overgrown and unkept, both feet and warm and well perfused to intact DP pulses bilaterally  Skin:    General: Skin is warm and dry.  Neurological:     Mental Status: He is alert and oriented to person, place, and time.  ED Results / Procedures / Treatments   Labs (all labs ordered are listed, but only abnormal results are displayed) Labs Reviewed - No data to display  EKG None  Radiology No results found.  Procedures Procedures (including critical care time)  Medications Ordered in ED Medications - No data to display  ED Course  I have reviewed the triage vital signs and the nursing notes.  Pertinent labs & imaging results that were available during my care of the patient were reviewed by me and considered in my medical decision making (see chart for details).    MDM Rules/Calculators/A&P  1:15 AM Patient seen and evaluated, 69 year old male here with bilateral foot pain from calluses.  He is currently homeless and walks a lot during the day.  He is very agitated and rude when I talk with him. Feet with calluses along the soles, worse on great toes  and balls of the foot.  Toenails are overgrown but feet are warm and well perfused with intact pulses.  There are no open wounds/sores. I have offered to refer him back to podiatry to which he becomes belligerent and raising his voice.  He states "I know by body, they just need to amputate the tip of my toes."  He becomes extremely agitated when I tell him this is not standard of care but can offer him something for pain tonight.  To this he states "I do not want to talk to the nurse, get me a real doctor".  At this point, I excused myself from the room.  Patient does not appear to have any medical emergency at this time and I feel that patient is stable for discharge.  He was given referral to podiatry if he would like to follow-up.  Final Clinical Impression(s) / ED Diagnoses Final diagnoses:  Callus of foot    Rx / DC Orders ED Discharge Orders    None       Garlon Hatchet, PA-C 04/28/19 0119    Melene Plan, DO 04/28/19 2979

## 2019-04-28 NOTE — Discharge Instructions (Signed)
You can follow-up with a podiatrist if you wish.

## 2019-05-12 ENCOUNTER — Emergency Department (HOSPITAL_COMMUNITY)
Admission: EM | Admit: 2019-05-12 | Discharge: 2019-05-12 | Disposition: A | Discharge status: Home / Self Care | Payer: Medicare Other | Attending: Emergency Medicine | Admitting: Emergency Medicine

## 2019-05-12 ENCOUNTER — Encounter (HOSPITAL_COMMUNITY): Payer: Self-pay | Admitting: Emergency Medicine

## 2019-05-12 DIAGNOSIS — Z5900 Homelessness unspecified: Secondary | ICD-10-CM

## 2019-05-12 DIAGNOSIS — M79672 Pain in left foot: Secondary | ICD-10-CM | POA: Insufficient documentation

## 2019-05-12 DIAGNOSIS — M79671 Pain in right foot: Secondary | ICD-10-CM

## 2019-05-12 DIAGNOSIS — Z79899 Other long term (current) drug therapy: Secondary | ICD-10-CM | POA: Insufficient documentation

## 2019-05-12 DIAGNOSIS — F17210 Nicotine dependence, cigarettes, uncomplicated: Secondary | ICD-10-CM | POA: Insufficient documentation

## 2019-05-12 NOTE — ED Notes (Addendum)
Upon assessment, patient's feet are pruned, appear brown with dirt, pedal pulses +2 bial;terally, circulation is delayed with capillary refill within 6 seconds.

## 2019-05-12 NOTE — ED Triage Notes (Signed)
[place], C/C painful bilateral feet, pain x2 months, has tingling and numbness x2 days, upon EMS presentation noted pruned feet with some blackness, able to stand and ambulate a few feet.   Vitals  BP 130/70 CBG 240 O2 99% T 97.7

## 2019-05-12 NOTE — ED Provider Notes (Signed)
[facility] DEPT Provider Note   CSN: [id number] Arrival date & time: 05/12/19  3716     History Chief Complaint  Patient presents with  . Foot Pain    Raymond Miles is a 69 y.o. male.  69 year old male with past medical history below including schizophrenia, seizures, homelessness, MI who presents with bilateral foot pain.  Patient has been seen here multiple times for bilateral foot pain.  He states that he ambulates frequently throughout the day.  Pain is burning on the bottom of both feet.  He states that he only has 1 pair of socks and they are currently wet.  He has shoes that have holes in them and are falling apart but he reports he will get money soon to buy another pair.  The history is provided by the patient.  Foot Pain       Past Medical History:  Diagnosis Date  . MI (myocardial infarction) ([place])   . Schizophrenia ([place])   . Seizures ([place])     There are no problems to display for this patient.   Past Surgical History:  Procedure Laterality Date  . 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 CARDIAC SURGERY         No family history on file.  Social History   Tobacco Use  . Smoking status: Current Every Day Smoker    Packs/day: 1.00    Types: Cigarettes  . Smokeless tobacco: Never Used  Substance Use Topics  . Alcohol use: Yes    Comment: periodically  . Drug use: Yes    Types: Cocaine    Comment: last time last week 9-19     Home Medications Prior to Admission medications   Medication Sig Start Date End Date Taking? Authorizing Provider  benztropine (COGENTIN) 0.5 MG tablet Take 0.5 mg by mouth 2 (two) times daily.   Yes [provider]  haloperidol decanoate (HALDOL DECANOATE) 50 MG/ML injection Inject 50 mg into the muscle every 28 (twenty-eight) days.   Yes [provider]    Allergies    Mellaril [thioridazine]  Review of Systems   Review of Systems  Musculoskeletal: Positive  for gait problem. Negative for joint swelling.  Skin: Positive for color change. Negative for wound.  Neurological: Negative for numbness.    Physical Exam Updated Vital Signs BP 117/86 (BP Location: Right Arm)   Pulse 74   Temp 98.6 F (37 C) (Oral)   Resp 16   SpO2 100%   Physical Exam Vitals and nursing note reviewed.  Constitutional:      General: He is not in acute distress.    Appearance: He is well-developed.     Comments: Disheveled, thin, chronically ill appearing  HENT:     Head: Normocephalic and atraumatic.  Eyes:     Conjunctiva/sclera: Conjunctivae normal.  Musculoskeletal:        General: Tenderness present. No swelling.     Cervical back: Neck supple.  Skin:    General: Skin is warm and dry.     Comments: Dirt between toes b/l and large callus on L 2nd toe, mild diffuse erythema of b/l plantar feet w/ generalized TTP but no skin breakdown, drainage, or ulcerations  Neurological:     Mental Status: He is alert and oriented to person, place, and time.  Psychiatric:     Comments: Calm, cooperative     ED Results / Procedures / Treatments   Labs (all labs ordered are  listed, but only abnormal results are displayed) Labs Reviewed - No data to display  EKG None  Radiology No results found.  Procedures Procedures (including critical care time)  Medications Ordered in ED Medications - No data to display  ED Course  I have reviewed the triage vital signs and the nursing notes.  MDM Rules/Calculators/A&P                      No skin breakdown, cellulitis, or ulcerations on visual exam. Suspect irritation from constant moisture, poor shoes, and repetitive walking. PT requested to soak feet here, which was provided.  Final Clinical Impression(s) / ED Diagnoses Final diagnoses:  Bilateral foot pain  Homelessness    Rx / DC Orders ED Discharge Orders    None       Osama Coleson, Ambrose Finland, MD 05/12/19 [phone]

## 2019-05-12 NOTE — ED Notes (Signed)
Patient given a dry pair of socks and discharge papers, DC instructions explained. Patient requested a bus pass, social work will bring one by.

## 2019-05-15 ENCOUNTER — Encounter (HOSPITAL_COMMUNITY): Payer: Self-pay | Admitting: Emergency Medicine

## 2019-05-15 ENCOUNTER — Other Ambulatory Visit: Payer: Self-pay

## 2019-05-15 ENCOUNTER — Emergency Department (HOSPITAL_COMMUNITY)
Admission: EM | Admit: 2019-05-15 | Discharge: 2019-05-15 | Disposition: A | Discharge status: Home / Self Care | Payer: Medicare Other | Attending: Emergency Medicine | Admitting: Emergency Medicine

## 2019-05-15 DIAGNOSIS — M79672 Pain in left foot: Secondary | ICD-10-CM | POA: Diagnosis not present

## 2019-05-15 DIAGNOSIS — T69029A Immersion foot, unspecified foot, initial encounter: Secondary | ICD-10-CM | POA: Insufficient documentation

## 2019-05-15 DIAGNOSIS — F17210 Nicotine dependence, cigarettes, uncomplicated: Secondary | ICD-10-CM | POA: Insufficient documentation

## 2019-05-15 DIAGNOSIS — Z79899 Other long term (current) drug therapy: Secondary | ICD-10-CM | POA: Diagnosis not present

## 2019-05-15 DIAGNOSIS — M79671 Pain in right foot: Secondary | ICD-10-CM | POA: Diagnosis present

## 2019-05-15 DIAGNOSIS — Z590 Homelessness: Secondary | ICD-10-CM | POA: Diagnosis not present

## 2019-05-15 DIAGNOSIS — I252 Old myocardial infarction: Secondary | ICD-10-CM | POA: Insufficient documentation

## 2019-05-15 DIAGNOSIS — X31XXXA Exposure to excessive natural cold, initial encounter: Secondary | ICD-10-CM | POA: Insufficient documentation

## 2019-05-15 DIAGNOSIS — F1410 Cocaine abuse, uncomplicated: Secondary | ICD-10-CM | POA: Insufficient documentation

## 2019-05-15 NOTE — ED Notes (Signed)
Pt walked out escorted by security. Pt refused to put on his shoes and carried them in a bag.

## 2019-05-15 NOTE — ED Provider Notes (Addendum)
[facility]-EMERGENCY DEPT Provider Note   CSN: [id number] Arrival date & time: 05/15/19  0441     History Chief Complaint  Patient presents with  . Foot Pain    Raymond Miles is a 69 y.o. male.  Patient presents to the emergency department with complaints of bilateral foot pain.  Patient has had frequent visits with similar complaints.  He is homeless and walks continuously.  It has been raining out tonight and his feet are wet.        Past Medical History:  Diagnosis Date  . MI (myocardial infarction) (HCC)   . Schizophrenia (HCC)   . Seizures (HCC)     There are no problems to display for this patient.   Past Surgical History:  Procedure Laterality Date  . 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 CARDIAC SURGERY         No family history on file.  Social History   Tobacco Use  . Smoking status: Current Every Day Smoker    Packs/day: 1.00    Types: Cigarettes  . Smokeless tobacco: Never Used  Substance Use Topics  . Alcohol use: Yes    Comment: periodically  . Drug use: Yes    Types: Cocaine    Comment: last time last week 9-19     Home Medications Prior to Admission medications   Medication Sig Start Date End Date Taking? Authorizing Provider  benztropine (COGENTIN) 0.5 MG tablet Take 0.5 mg by mouth 2 (two) times daily.    [provider]  haloperidol decanoate (HALDOL DECANOATE) 50 MG/ML injection Inject 50 mg into the muscle every 28 (twenty-eight) days.    [provider]    Allergies    Mellaril [thioridazine]  Review of Systems   Review of Systems  Skin: Positive for rash.  All other systems reviewed and are negative.   Physical Exam Updated Vital Signs SpO2 97%   Physical Exam Vitals and nursing note reviewed.  Constitutional:      Appearance: Normal appearance.  HENT:     Head: Normocephalic and atraumatic.  Eyes:     Pupils: Pupils are equal, round, and reactive to light.    Cardiovascular:     Rate and Rhythm: Normal rate and regular rhythm.  Pulmonary:     Effort: Pulmonary effort is normal.     Breath sounds: Normal breath sounds.  Abdominal:     Palpations: Abdomen is soft.  Musculoskeletal:        General: Normal range of motion.     Cervical back: Normal range of motion.  Skin:    Comments: Generalized tenderness of the plantar aspect of both feet with pruning of the overlying skin, no erythema, warmth, induration, fluctuance or drainage  Neurological:     Mental Status: He is alert.         ED Results / Procedures / Treatments   Labs (all labs ordered are listed, but only abnormal results are displayed) Labs Reviewed - No data to display  EKG None  Radiology No results found.  Procedures Procedures (including critical care time)  Medications Ordered in ED Medications - No data to display  ED Course  I have reviewed the triage vital signs and the nursing notes.  Pertinent labs & imaging results that were available during my care of the patient were reviewed by me and considered in my medical decision making (see chart for details).    MDM Rules/Calculators/A&P  Patient presents with burning and maceration of the superficial layers of skin on plantar aspect of both feet secondary to walking in the rain.  No signs of infection.  Reasonably normal capillary refill.  Does not require any specific intervention.  Will be allowed to rest here in the ER, warm up and will provide warm close. Final Clinical Impression(s) / ED Diagnoses Final diagnoses:  Trench foot, unspecified laterality, initial encounter    Rx / DC Orders ED Discharge Orders    None       Zachary Lovins, Gwenyth Allegra, MD 05/15/19 0973    Orpah Greek, MD 05/15/19 [phone]

## 2019-05-15 NOTE — ED Triage Notes (Signed)
Pt arriving via EMS from the street with bilateral foot pain. Pt was seen for same on 05/12/19. Pt refused to all EMS to check his BP. Pt was able to stand from wheelchair and put himself in the bed without issue.

## 2019-05-15 NOTE — ED Notes (Signed)
Pt refused to have vital signs assessed  

## 2019-05-15 NOTE — ED Notes (Signed)
Pt refused to have vital signs assessed

## 2019-05-15 NOTE — ED Notes (Signed)
Pt sleeping. 

## 2019-05-15 NOTE — ED Notes (Signed)
Patient encouraged to put on socks and shoes, patient refusing and holding his shoes in his hands.

## 2019-05-15 NOTE — ED Notes (Signed)
Pt eating food from personal plastic bag

## 2019-05-15 NOTE — ED Notes (Signed)
Patient yelling stating that he is not leaving, that he has nowhere else to go. Patient using foul language and aggressive towards staff. Security called to bedside.

## 2019-06-19 ENCOUNTER — Emergency Department (HOSPITAL_COMMUNITY): Payer: Medicare Other

## 2019-06-19 ENCOUNTER — Other Ambulatory Visit: Payer: Self-pay

## 2019-06-19 ENCOUNTER — Encounter (HOSPITAL_COMMUNITY): Payer: Self-pay

## 2019-06-19 ENCOUNTER — Emergency Department (HOSPITAL_COMMUNITY)
Admission: EM | Admit: 2019-06-19 | Discharge: 2019-06-19 | Disposition: A | Discharge status: Home / Self Care | Payer: Medicare Other | Attending: Emergency Medicine | Admitting: Emergency Medicine

## 2019-06-19 DIAGNOSIS — M79672 Pain in left foot: Secondary | ICD-10-CM

## 2019-06-19 DIAGNOSIS — L84 Corns and callosities: Secondary | ICD-10-CM | POA: Diagnosis not present

## 2019-06-19 DIAGNOSIS — M25551 Pain in right hip: Secondary | ICD-10-CM | POA: Insufficient documentation

## 2019-06-19 DIAGNOSIS — F17210 Nicotine dependence, cigarettes, uncomplicated: Secondary | ICD-10-CM | POA: Diagnosis not present

## 2019-06-19 DIAGNOSIS — F1410 Cocaine abuse, uncomplicated: Secondary | ICD-10-CM | POA: Diagnosis not present

## 2019-06-19 MED ORDER — KETOROLAC TROMETHAMINE 60 MG/2ML IM SOLN
30.0000 mg | Freq: Once | INTRAMUSCULAR | Status: AC
  Administered 2019-06-19: 30 mg via INTRAMUSCULAR
  Filled 2019-06-19: qty 2

## 2019-06-19 MED ORDER — ACETAMINOPHEN 500 MG PO TABS
1000.0000 mg | ORAL_TABLET | Freq: Once | ORAL | Status: AC
  Administered 2019-06-19: 1000 mg via ORAL
  Filled 2019-06-19: qty 2

## 2019-06-19 NOTE — ED Provider Notes (Signed)
[facility] DEPT Provider Note   CSN: [id number] Arrival date & time: 06/19/19  5397     History Chief Complaint  Patient presents with  . Hip Pain    Raymond Miles is a 69 y.o. male.  Patient is a 69 year old male with a history of schizophrenia, seizures and prior MI who is presenting today with several complaints.  Patient reports that his left foot has been hurting him severely for months now and there is a callus there that is causing excruciating 10 out of 10 pain.  He does report that he is homeless and currently sleeping outside and walks all day long.  He has been seen in the emergency room multiple times for this pain but feels like it is worse today.  Secondly he also reports that last night he tripped over something when he was walking and fell on his right hip.  Since that time he has had significant pain in his right hip and upper femur area.  It hurts to move and walk.  He also reports for months now he has had numbness in his left arm and leg but denies any weakness in these areas.  He has had no headache, visual changes, speech changes or difficulty swallowing.  No cough, congestion, shortness of breath, chest pain, abdominal pain, vomiting or diarrhea this week.  He reports he still taking his medications.  He does report tobacco abuse and occasional alcohol use.  The history is provided by the patient.  Hip Pain This is a new problem.       Past Medical History:  Diagnosis Date  . MI (myocardial infarction) ([place])   . Schizophrenia ([place])   . Seizures ([place])     There are no problems to display for this patient.   Past Surgical History:  Procedure Laterality Date  . 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 CARDIAC SURGERY         History reviewed. No pertinent family history.  Social History   Tobacco Use  . Smoking status: Current Every Day Smoker    Packs/day: 1.00    Types: Cigarettes  . Smokeless tobacco:  Never Used  Substance Use Topics  . Alcohol use: Yes    Comment: periodically  . Drug use: Yes    Types: Cocaine    Comment: last time last week 9-19     Home Medications Prior to Admission medications   Medication Sig Start Date End Date Taking? Authorizing Provider  benztropine (COGENTIN) 0.5 MG tablet Take 0.5 mg by mouth 2 (two) times daily.    [provider]  haloperidol decanoate (HALDOL DECANOATE) 50 MG/ML injection Inject 50 mg into the muscle every 28 (twenty-eight) days.    [provider]    Allergies    Mellaril [thioridazine]  Review of Systems   Review of Systems  All other systems reviewed and are negative.   Physical Exam Updated Vital Signs BP 108/73   Pulse 79   Temp 97.6 F (36.4 C) (Oral)   Resp 18   Ht 6\' 1"  (1.854 m)   Wt 74.8 kg   SpO2 100%   BMI 21.77 kg/m   Physical Exam Vitals and nursing note reviewed.  Constitutional:      General: He is not in acute distress.    Appearance: He is well-developed and normal weight.     Comments: Slightly disheveled  HENT:     Head: Normocephalic and atraumatic.  Eyes:  Conjunctiva/sclera: Conjunctivae normal.     Pupils: Pupils are equal, round, and reactive to light.  Cardiovascular:     Rate and Rhythm: Normal rate and regular rhythm.     Pulses: Normal pulses.     Heart sounds: No murmur.  Pulmonary:     Effort: Pulmonary effort is normal. No respiratory distress.     Breath sounds: Normal breath sounds. No wheezing or rales.  Abdominal:     General: There is no distension.     Palpations: Abdomen is soft.     Tenderness: There is no abdominal tenderness. There is no guarding or rebound.  Musculoskeletal:        General: Tenderness present. Normal range of motion.     Cervical back: Normal range of motion and neck supple.     Right hip: Tenderness and bony tenderness present. Normal range of motion.     Right lower leg: No edema.     Left lower leg: No edema.        Legs:       Feet:  Skin:    General: Skin is warm and dry.     Findings: No erythema or rash.  Neurological:     Mental Status: He is alert and oriented to person, place, and time. Mental status is at baseline.     Comments: 5 out of 5 strength in all 4 extremities.  Sensation intact bilaterally.  Psychiatric:        Behavior: Behavior normal.     Comments: Calm and cooperative     ED Results / Procedures / Treatments   Labs (all labs ordered are listed, but only abnormal results are displayed) Labs Reviewed - No data to display  EKG None  Radiology DG Foot Complete Left  Result Date: 06/19/2019 CLINICAL DATA:  Pain EXAM: LEFT FOOT - COMPLETE 3+ VIEW COMPARISON:  January 16, 2019 FINDINGS: Frontal, oblique, and lateral views were obtained. There is no evident fracture or dislocation. There is moderate narrowing of the first MTP joint. Other joint spaces appear unremarkable. There is hallux valgus deformity at the second DIP joint. Other joint spaces appear unremarkable. No erosive change. IMPRESSION: Stable osteoarthritic change at the first MTP joint. Hallux valgus deformity at second DIP joint. No fracture or dislocation. Electronically Signed   By: Bretta Bang III M.D.   On: 06/19/2019 07:53   DG Hip Unilat W or Wo Pelvis 2-3 Views Right  Result Date: 06/19/2019 CLINICAL DATA:  RIGHT hip pain with numbness down RIGHT leg for the past 4 months. EXAM: DG HIP (WITH OR WITHOUT PELVIS) 2-3V RIGHT COMPARISON:  Plain film of the pelvis and RIGHT hip dated 05/22/2018. FINDINGS: Osseous alignment is normal. No fracture line or displaced fracture fragment is seen. No acute or suspicious osseous lesion. Degenerative spurring noted at both hip joints, LEFT greater than RIGHT, with well corticated fragments again seen lateral to each acetabulum. Soft tissues about the pelvis and RIGHT hip are otherwise unremarkable. Additional degenerative change in the lower lumbar spine, likely mild to  moderate in degree. IMPRESSION: 1. No acute findings. 2. 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 3. 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 Electronically Signed   By: Bary Richard M.D.   On: 06/19/2019 07:56    Procedures Procedures (including critical care time)  Medications Ordered in ED Medications  ketorolac (TORADOL) injection 30 mg (30 mg Intramuscular Given 06/19/19 0711)  acetaminophen (TYLENOL)  tablet 1,000 mg (1,000 mg Oral Given 06/19/19 7425)    ED Course  I have reviewed the triage vital signs and the nursing notes.  Pertinent labs & imaging results that were available during my care of the patient were reviewed by me and considered in my medical decision making (see chart for details).    MDM Rules/Calculators/A&P                       Final Clinical Impression(s) / ED Diagnoses Final diagnoses:  Right hip pain  Left foot pain  Callus of foot   69 year old male presenting with complaints of pain in his right hip after a fall yesterday as well as pain in his left foot.  Patient does have a callus to the left foot but no evidence of chronic wound or cellulitis.  We will do an x-ray of the foot to ensure no concern for osteo or fracture.  Patient does walk a lot and most likely related to a pressure point where his shoe is rubbing.  Patient is not diabetic and has no other evidence of wounds.  Low suspicion for neuropathy at this time.  Also patient has tenderness with palpation to his right hip and upper femur.  However patient was able to ambulate with lower suspicion for femur fracture but possibility for pelvic fracture.  Patient does briefly state that his left arm and leg are numb however there is no evidence of objective findings as patient has normal sensation with palpation of bilateral upper and lower extremities with normal strength.  He has no localized point back tenderness or concern for acute  infectious or neurologic pathology at this time.  Patient given medication for pain control and imaging is pending.  8:55 AM I have personally reviewed and interpreted imaging which is negative for acute injury.  Patient does have osteoarthritic changes but no other acute issues.  Calluses were removed from the toes without difficulty.  At this time no other acute issues are identified and feel that patient is stable for discharge.  Rx / DC Orders ED Discharge Orders    None       Gwyneth Sprout, MD 06/19/19 [phone]

## 2019-06-19 NOTE — ED Triage Notes (Signed)
Pt to ED with c/o right hip pain with numbness down right leg for the past 4 months. Pt states also has callosus on right foot that also have been killing him. Pt states pain is 10/10

## 2019-07-08 ENCOUNTER — Emergency Department (HOSPITAL_COMMUNITY)
Admission: EM | Admit: 2019-07-08 | Discharge: 2019-07-08 | Disposition: A | Discharge status: Home / Self Care | Payer: Medicare Other | Attending: Emergency Medicine | Admitting: Emergency Medicine

## 2019-07-08 ENCOUNTER — Encounter (HOSPITAL_COMMUNITY): Payer: Self-pay | Admitting: Emergency Medicine

## 2019-07-08 DIAGNOSIS — Z79899 Other long term (current) drug therapy: Secondary | ICD-10-CM | POA: Diagnosis not present

## 2019-07-08 DIAGNOSIS — I252 Old myocardial infarction: Secondary | ICD-10-CM | POA: Diagnosis not present

## 2019-07-08 DIAGNOSIS — L84 Corns and callosities: Secondary | ICD-10-CM | POA: Diagnosis not present

## 2019-07-08 DIAGNOSIS — F17210 Nicotine dependence, cigarettes, uncomplicated: Secondary | ICD-10-CM | POA: Diagnosis not present

## 2019-07-08 DIAGNOSIS — G8929 Other chronic pain: Secondary | ICD-10-CM

## 2019-07-08 DIAGNOSIS — M25512 Pain in left shoulder: Secondary | ICD-10-CM | POA: Diagnosis not present

## 2019-07-08 DIAGNOSIS — M79673 Pain in unspecified foot: Secondary | ICD-10-CM | POA: Diagnosis present

## 2019-07-08 DIAGNOSIS — X31XXXA Exposure to excessive natural cold, initial encounter: Secondary | ICD-10-CM | POA: Diagnosis not present

## 2019-07-08 DIAGNOSIS — T69029A Immersion foot, unspecified foot, initial encounter: Secondary | ICD-10-CM | POA: Insufficient documentation

## 2019-07-08 MED ORDER — ACETAMINOPHEN 325 MG PO TABS
650.0000 mg | ORAL_TABLET | Freq: Once | ORAL | Status: AC
  Administered 2019-07-08: 650 mg via ORAL
  Filled 2019-07-08: qty 2

## 2019-07-08 NOTE — ED Notes (Signed)
Patient given sandwich, juice and is now ambulating without difficulty to restroom.

## 2019-07-08 NOTE — ED Notes (Signed)
ED Provider at bedside. 

## 2019-07-08 NOTE — ED Notes (Addendum)
Patient refusing the application of ice.

## 2019-07-08 NOTE — Discharge Instructions (Signed)
We saw in the ER for foot pain.  It appears that the pain is secondary to excessive weightbearing and walking. Take Tylenol for pain control. The shoulder pain is likely because of arthritis.  Also continue over-the-counter medicine for pain control.

## 2019-07-08 NOTE — ED Triage Notes (Signed)
Per [facility]-left sided pain, arm, leg, feet-chronic-patient is homeless

## 2019-07-08 NOTE — ED Provider Notes (Signed)
[facility] DEPT Provider Note   CSN: [id number] Arrival date & time: 07/08/19  7829     History Chief Complaint  Patient presents with  . Foot Pain    Raymond Miles is a 69 y.o. male.  HPI    69 year old comes in a chief complaint of foot pain.  Patient has history of seizures, schizophrenia and MI.  Patient is homeless.  He reports that he has been having foot pain for the last several months.  The pain is worse today.  His pain is worse with any kind of ambulation.  He is also complaining of shoulder pain, that has been present for several months also.  No nausea, vomiting, fevers, chills.  Patient wants his foot soaked, similar to how it was done in the past.   Past Medical History:  Diagnosis Date  . MI (myocardial infarction) ([place])   . Schizophrenia ([place])   . Seizures ([place])     There are no problems to display for this patient.   Past Surgical History:  Procedure Laterality Date  . 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 CARDIAC SURGERY         No family history on file.  Social History   Tobacco Use  . Smoking status: Current Every Day Smoker    Packs/day: 1.00    Types: Cigarettes  . Smokeless tobacco: Never Used  Substance Use Topics  . Alcohol use: Yes    Comment: periodically  . Drug use: Yes    Types: Cocaine    Comment: last time last week 9-19     Home Medications Prior to Admission medications   Medication Sig Start Date End Date Taking? Authorizing Provider  benztropine (COGENTIN) 0.5 MG tablet Take 0.5 mg by mouth 2 (two) times daily.    [provider]  haloperidol decanoate (HALDOL DECANOATE) 50 MG/ML injection Inject 50 mg into the muscle every 28 (twenty-eight) days.    [provider]    Allergies    Mellaril [thioridazine]  Review of Systems   Review of Systems  Constitutional: Positive for activity change.  Musculoskeletal: Positive for arthralgias.  Skin: Positive  for rash.  Neurological: Negative for weakness.  All other systems reviewed and are negative.   Physical Exam Updated Vital Signs BP (!) 138/106 (BP Location: Left Arm)   Pulse 69   Temp 97.8 F (36.6 C) (Oral)   Resp 17   SpO2 96%   Physical Exam Vitals and nursing note reviewed.  Constitutional:      Appearance: He is well-developed.  HENT:     Head: Normocephalic and atraumatic.  Eyes:     Conjunctiva/sclera: Conjunctivae normal.     Pupils: Pupils are equal, round, and reactive to light.  Cardiovascular:     Rate and Rhythm: Normal rate and regular rhythm.  Pulmonary:     Effort: Pulmonary effort is normal.     Breath sounds: Normal breath sounds.  Abdominal:     General: Bowel sounds are normal. There is no distension.     Palpations: Abdomen is soft. There is no mass.     Tenderness: There is no abdominal tenderness. There is no guarding or rebound.  Musculoskeletal:        General: No deformity.     Cervical back: Normal range of motion and neck supple.  Skin:    General: Skin is warm.  Neurological:     Mental Status: He is alert and oriented  to person, place, and time.     ED Results / Procedures / Treatments   Labs (all labs ordered are listed, but only abnormal results are displayed) Labs Reviewed - No data to display  EKG None  Radiology No results found.  Procedures Procedures (including critical care time)  Medications Ordered in ED Medications  acetaminophen (TYLENOL) tablet 650 mg (650 mg Oral Given 07/08/19 1150)    ED Course  I have reviewed the triage vital signs and the nursing notes.  Pertinent labs & imaging results that were available during my care of the patient were reviewed by me and considered in my medical decision making (see chart for details).    MDM Rules/Calculators/A&P                      Patient comes in a chief complaint of foot pain and shoulder pain. He is noted to have some callus.  No blisters.  Tenderness  to palpation over the plantar surface bilateral.  Left shoulder range of motion is intact, but he is tender with it.  Constitutional negative for any underlying infectious process.  Stable for discharge.  Final Clinical Impression(s) / ED Diagnoses Final diagnoses:  Foot callus  Trench foot, unspecified laterality, initial encounter  Chronic left shoulder pain    Rx / DC Orders ED Discharge Orders    None       Derwood Kaplan, MD 07/08/19 1249

## 2019-09-08 ENCOUNTER — Emergency Department (HOSPITAL_COMMUNITY)
Admission: EM | Admit: 2019-09-08 | Discharge: 2019-09-08 | Disposition: A | Discharge status: Home / Self Care | Payer: Medicare Other | Attending: Emergency Medicine | Admitting: Emergency Medicine

## 2019-09-08 ENCOUNTER — Encounter (HOSPITAL_COMMUNITY): Payer: Self-pay | Admitting: Emergency Medicine

## 2019-09-08 DIAGNOSIS — M79671 Pain in right foot: Secondary | ICD-10-CM | POA: Diagnosis not present

## 2019-09-08 DIAGNOSIS — Z79899 Other long term (current) drug therapy: Secondary | ICD-10-CM | POA: Insufficient documentation

## 2019-09-08 DIAGNOSIS — I252 Old myocardial infarction: Secondary | ICD-10-CM | POA: Diagnosis not present

## 2019-09-08 DIAGNOSIS — F17210 Nicotine dependence, cigarettes, uncomplicated: Secondary | ICD-10-CM | POA: Diagnosis not present

## 2019-09-08 DIAGNOSIS — M79672 Pain in left foot: Secondary | ICD-10-CM

## 2019-09-08 NOTE — ED Triage Notes (Signed)
Pt arrives via [facility], pt is homeless, c/o bilateral foot swelling and pain that has been ongoing "for months." states that pain makes it hard for him to walk. Pt has callus to L 2nd toe with some swelling present.

## 2019-09-08 NOTE — ED Provider Notes (Signed)
[facility] EMERGENCY DEPARTMENT Provider Note   CSN: [id number] Arrival date & time: 09/08/19  1233     History Chief Complaint  Patient presents with  . Foot Swelling    Raymond Miles is a 69 y.o. male.  Patient is a 69 year old male who presents with foot pain.  He has a history of schizophrenia, seizures and prior MI.  He has had a history of frequent ED visits for foot pain.  He says his feet hurt.  He says his shoes do not fit right and they scratch on them and he also has calluses.  He denies any other injuries to his feet.  No fevers.        Past Medical History:  Diagnosis Date  . MI (myocardial infarction) ([place])   . Schizophrenia ([place])   . Seizures ([place])     There are no problems to display for this patient.   Past Surgical History:  Procedure Laterality Date  . 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 CARDIAC SURGERY         No family history on file.  Social History   Tobacco Use  . Smoking status: Current Every Day Smoker    Packs/day: 1.00    Types: Cigarettes  . Smokeless tobacco: Never Used  Substance Use Topics  . Alcohol use: Yes    Comment: periodically  . Drug use: Yes    Types: Cocaine    Comment: last time last week 9-19     Home Medications Prior to Admission medications   Medication Sig Start Date End Date Taking? Authorizing Provider  benztropine (COGENTIN) 0.5 MG tablet Take 0.5 mg by mouth 2 (two) times daily.    [provider]  haloperidol decanoate (HALDOL DECANOATE) 50 MG/ML injection Inject 50 mg into the muscle every 28 (twenty-eight) days.    [provider]    Allergies    Mellaril [thioridazine]  Review of Systems   Review of Systems  Constitutional: Negative for fever.  Gastrointestinal: Negative for nausea and vomiting.  Musculoskeletal: Positive for arthralgias. Negative for back pain, joint swelling and neck pain.  Skin: Negative for wound.  Neurological:  Negative for weakness, numbness and headaches.    Physical Exam Updated Vital Signs BP (!) 133/101 (BP Location: Right Arm)   Pulse 80   Temp 98.3 F (36.8 C) (Oral)   Resp 20   SpO2 100%   Physical Exam Constitutional:      Appearance: He is well-developed.  HENT:     Head: Normocephalic and atraumatic.  Cardiovascular:     Rate and Rhythm: Normal rate.  Pulmonary:     Effort: Pulmonary effort is normal.  Musculoskeletal:        General: No tenderness.     Cervical back: Normal range of motion and neck supple.     Comments: No visible swelling of the feet.  There are some small superficial abrasions around his toes where he says is she was or scratching him.  He has some calluses in place but there is no warmth or erythema.  No drainage or signs of infection.  He has onychomycosis.  Pedal pulses are intact.  He has normal sensation and motor function to the feet.  Skin:    General: Skin is warm and dry.  Neurological:     Mental Status: He is alert and oriented to person, place, and time.     ED Results / Procedures / Treatments  Labs (all labs ordered are listed, but only abnormal results are displayed) Labs Reviewed - No data to display  EKG None  Radiology No results found.  Procedures Procedures (including critical care time)  Medications Ordered in ED Medications - No data to display  ED Course  I have reviewed the triage vital signs and the nursing notes.  Pertinent labs & imaging results that were available during my care of the patient were reviewed by me and considered in my medical decision making (see chart for details).    MDM Rules/Calculators/A&P                          Patient is able to ambulate without difficulty.  No recent trauma.  Neurovascularly intact.  No swelling or suggestions of DVT.  No signs of infection.  He was discharged home in good condition.  He was given information about following up with the [facility] wellness  [facility]. Final Clinical Impression(s) / ED Diagnoses Final diagnoses:  Foot pain, bilateral    Rx / DC Orders ED Discharge Orders    None       Rolan Bucco, MD 09/08/19 [phone]

## 2019-09-08 NOTE — ED Notes (Signed)
Patient verbalizes understanding of discharge instructions. Opportunity for questioning and answers were provided. Armband removed by staff, pt discharged from ED to home. Bus voucher and meal provided.

## 2019-09-22 ENCOUNTER — Encounter (HOSPITAL_COMMUNITY): Payer: Self-pay

## 2019-09-22 ENCOUNTER — Emergency Department (HOSPITAL_COMMUNITY)
Admission: EM | Admit: 2019-09-22 | Discharge: 2019-09-23 | Disposition: A | Discharge status: Home / Self Care | Payer: Medicare Other | Attending: Emergency Medicine | Admitting: Emergency Medicine

## 2019-09-22 DIAGNOSIS — M79672 Pain in left foot: Secondary | ICD-10-CM | POA: Diagnosis not present

## 2019-09-22 DIAGNOSIS — Z5321 Procedure and treatment not carried out due to patient leaving prior to being seen by health care provider: Secondary | ICD-10-CM | POA: Insufficient documentation

## 2019-09-22 NOTE — ED Notes (Signed)
Pt called x3 for bed assignment, NA x3.

## 2019-09-22 NOTE — ED Triage Notes (Signed)
Pt BIB [facility] for eval of chronic foot pain L worse than R. Pt has multiple visits for trench foot and chronic L sided foot pain. Pt reports that he also believes he may have been poisoned since the last time he was here,a woman on a TV show was poisoned and she has similar sx. Pt denies SI/HI.

## 2019-09-24 ENCOUNTER — Encounter (HOSPITAL_COMMUNITY): Payer: Self-pay | Admitting: Emergency Medicine

## 2019-09-24 ENCOUNTER — Emergency Department (HOSPITAL_COMMUNITY): Payer: Medicare Other

## 2019-09-24 ENCOUNTER — Emergency Department (HOSPITAL_COMMUNITY)
Admission: EM | Admit: 2019-09-24 | Discharge: 2019-09-24 | Disposition: A | Discharge status: Home / Self Care | Payer: Medicare Other | Attending: Emergency Medicine | Admitting: Emergency Medicine

## 2019-09-24 ENCOUNTER — Other Ambulatory Visit: Payer: Self-pay

## 2019-09-24 DIAGNOSIS — M79602 Pain in left arm: Secondary | ICD-10-CM | POA: Diagnosis present

## 2019-09-24 DIAGNOSIS — R531 Weakness: Secondary | ICD-10-CM | POA: Diagnosis not present

## 2019-09-24 DIAGNOSIS — F17210 Nicotine dependence, cigarettes, uncomplicated: Secondary | ICD-10-CM | POA: Diagnosis not present

## 2019-09-24 DIAGNOSIS — R202 Paresthesia of skin: Secondary | ICD-10-CM | POA: Insufficient documentation

## 2019-09-24 DIAGNOSIS — R200 Anesthesia of skin: Secondary | ICD-10-CM

## 2019-09-24 LAB — CBC WITH DIFFERENTIAL/PLATELET
Abs Immature Granulocytes: 0 10*3/uL (ref 0.00–0.07)
Basophils Absolute: 0 10*3/uL (ref 0.0–0.1)
Basophils Relative: 1 %
Eosinophils Absolute: 0.3 10*3/uL (ref 0.0–0.5)
Eosinophils Relative: 6 %
HCT: 41.3 % (ref 39.0–52.0)
Hemoglobin: 13.1 g/dL (ref 13.0–17.0)
Immature Granulocytes: 0 %
Lymphocytes Relative: 34 %
Lymphs Abs: 1.7 10*3/uL (ref 0.7–4.0)
MCH: 28.4 pg (ref 26.0–34.0)
MCHC: 31.7 g/dL (ref 30.0–36.0)
MCV: 89.6 fL (ref 80.0–100.0)
Monocytes Absolute: 0.4 10*3/uL (ref 0.1–1.0)
Monocytes Relative: 7 %
Neutro Abs: 2.6 10*3/uL (ref 1.7–7.7)
Neutrophils Relative %: 52 %
Platelets: 331 10*3/uL (ref 150–400)
RBC: 4.61 MIL/uL (ref 4.22–5.81)
RDW: 14.4 % (ref 11.5–15.5)
WBC: 4.9 10*3/uL (ref 4.0–10.5)
nRBC: 0 % (ref 0.0–0.2)

## 2019-09-24 LAB — BASIC METABOLIC PANEL
Anion gap: 7 (ref 5–15)
BUN: 12 mg/dL (ref 8–23)
CO2: 29 mmol/L (ref 22–32)
Calcium: 9 mg/dL (ref 8.9–10.3)
Chloride: 103 mmol/L (ref 98–111)
Creatinine, Ser: 0.61 mg/dL (ref 0.61–1.24)
GFR calc Af Amer: >60 mL/min (ref >60)
GFR calc non Af Amer: >60 mL/min (ref >60)
Glucose, Bld: 98 mg/dL (ref 70–99)
Potassium: 3.5 mmol/L (ref 3.5–5.1)
Sodium: 139 mmol/L (ref 135–145)
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
## 2019-09-24 NOTE — ED Triage Notes (Addendum)
Patient arrived with EMS from street ( homeless) fell 2 days ago due to intermittent numbness at left leg , reports pain at left leg and left upper arm . Ambulatory .

## 2019-09-24 NOTE — Discharge Instructions (Signed)
All the results in the ER are normal, labs and imaging. We are not sure what is causing your symptoms. The workup in the ER is not complete, and is limited to screening for life threatening and emergent conditions only, so please see a primary care doctor for further evaluation.  

## 2019-09-30 ENCOUNTER — Emergency Department (HOSPITAL_COMMUNITY)
Admission: EM | Admit: 2019-09-30 | Discharge: 2019-09-30 | Disposition: A | Discharge status: Home / Self Care | Payer: Medicare Other | Attending: Emergency Medicine | Admitting: Emergency Medicine

## 2019-09-30 ENCOUNTER — Emergency Department (HOSPITAL_COMMUNITY): Payer: Medicare Other

## 2019-09-30 DIAGNOSIS — F17210 Nicotine dependence, cigarettes, uncomplicated: Secondary | ICD-10-CM | POA: Diagnosis not present

## 2019-09-30 DIAGNOSIS — L03119 Cellulitis of unspecified part of limb: Secondary | ICD-10-CM

## 2019-09-30 DIAGNOSIS — M79672 Pain in left foot: Secondary | ICD-10-CM | POA: Diagnosis present

## 2019-09-30 DIAGNOSIS — L03116 Cellulitis of left lower limb: Secondary | ICD-10-CM | POA: Insufficient documentation

## 2019-09-30 MED ORDER — CEPHALEXIN 500 MG PO CAPS: 500.0000 mg | ORAL_CAPSULE | Freq: Four times a day (QID) | ORAL | 0 refills | Status: AC

## 2019-09-30 NOTE — ED Provider Notes (Signed)
[facility]-EMERGENCY DEPT Provider Note   CSN: [id number] Arrival date & time: 09/30/19  1056     History Chief Complaint  Patient presents with  . Foot Pain    Raymond Miles is a 69 y.o. male with PMH/o MI, Schizophrenia, Seizures who presents for evaluation of left foot pain.  He reports that this is been an ongoing issue for the last week or so.  He states that he hit his foot a few weeks ago and states that the toenail of his first toe is coming off.  He reports that earlier today, he pulled it off.  Since then, he has had pain around his foot.  He has also noted some bleeding and some redness.  He has been able to ambulate.  He does not recall any fall.  He has not taken any medication for the pain.  He has not had any fevers. He denies any exogenous hormone use, recent immobilization, prior history of DVT/PE, recent surgery, leg swelling, or long travel.  The history is provided by the patient.       Past Medical History:  Diagnosis Date  . MI (myocardial infarction) (HCC)   . Schizophrenia (HCC)   . Seizures (HCC)     There are no problems to display for this patient.   Past Surgical History:  Procedure Laterality Date  . 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 CARDIAC SURGERY         No family history on file.  Social History   Tobacco Use  . Smoking status: Current Every Day Smoker    Packs/day: 1.00    Types: Cigarettes  . Smokeless tobacco: Never Used  Substance Use Topics  . Alcohol use: Yes    Comment: periodically  . Drug use: Yes    Types: Cocaine    Comment: last time last week 9-19     Home Medications Prior to Admission medications   Medication Sig Start Date End Date Taking? Authorizing Provider  benztropine (COGENTIN) 0.5 MG tablet Take 0.5 mg by mouth 2 (two) times daily.    [provider]  cephALEXin (KEFLEX) 500 MG capsule Take 1 capsule (500 mg total) by mouth 4 (four) times daily for 7 days.  09/30/19 10/07/19  Graciella Freer A, PA-C  haloperidol decanoate (HALDOL DECANOATE) 50 MG/ML injection Inject 50 mg into the muscle every 28 (twenty-eight) days.    [provider]    Allergies    Mellaril [thioridazine]  Review of Systems   Review of Systems  Constitutional: Negative for fever.  Respiratory: Negative for cough and shortness of breath.   Cardiovascular: Negative for chest pain.  Gastrointestinal: Negative for abdominal pain, nausea and vomiting.  Genitourinary: Negative for dysuria and hematuria.  Musculoskeletal:       Foot pain  Skin: Positive for color change.  Neurological: Negative for weakness, numbness and headaches.  All other systems reviewed and are negative.   Physical Exam Updated Vital Signs BP (!) 138/102   Pulse 78   Temp 98.2 F (36.8 C) (Oral)   Resp 16   SpO2 100%   Physical Exam Vitals and nursing note reviewed.  Constitutional:      Appearance: He is well-developed.  HENT:     Head: Normocephalic and atraumatic.  Eyes:     General: No scleral icterus.       Right eye: No discharge.        Left eye: No discharge.  Conjunctiva/sclera: Conjunctivae normal.  Cardiovascular:     Pulses:          Dorsalis pedis pulses are 2+ on the right side and 2+ on the left side.  Pulmonary:     Effort: Pulmonary effort is normal.  Musculoskeletal:       Feet:     Comments: Bilateral lower extremities are symmetric in appearance without any overlying warmth, edema. No calf tenderness to palpation noted to the LLE.   Feet:     Comments: Mild erythema, edema noted to the dorsal aspect of the left foot that is isolated around the first toe into the dorsal aspect.  It does not cross over the ankle joint.  It does not extend circumferentially around the entire foot. Skin:    General: Skin is warm and dry.     Comments: Good distal cap refill.  LLE is not dusky in appearance or cool to touch.  Neurological:     Mental Status: He is alert.      Comments: Sensation intact along major nerve distributions of LLE  Psychiatric:        Speech: Speech normal.        Behavior: Behavior normal.      ED Results / Procedures / Treatments   Labs (all labs ordered are listed, but only abnormal results are displayed) Labs Reviewed - No data to display  EKG None  Radiology DG Foot Complete Left  Result Date: 09/30/2019 CLINICAL DATA:  Foot pain.  No trauma history submitted. EXAM: LEFT FOOT - COMPLETE 3+ VIEW COMPARISON:  06/19/2019 FINDINGS: Mild hallux valgus deformity with degenerative changes the first metatarsophalangeal joint again identified. No acute fracture or dislocation. No periosteal reaction or callus deposition. No radiopaque foreign object. IMPRESSION: No acute osseous abnormality. Electronically Signed   By: Jeronimo Greaves M.D.   On: 09/30/2019 14:40    Procedures Procedures (including critical care time)  Medications Ordered in ED Medications - No data to display  ED Course  I have reviewed the triage vital signs and the nursing notes.  Pertinent labs & imaging results that were available during my care of the patient were reviewed by me and considered in my medical decision making (see chart for details).    MDM Rules/Calculators/A&P                          69 year old male who presents for evaluation of left foot pain that has been ongoing for the last week.  He states that he has had calluses and foot pain for a long time.  He states that something caught on his toenail a few weeks ago and his toenail was hanging off.  He pulled it off.  Since then, he has had worsening pain.  He has not noted any fevers.  No DVT risk factors.  On exam, he has some warmth and erythema to dorsal aspect of foot, particular on the first toe.  It does not extend circumferentially around the foot or over the joint line.  He is able to move the foot without any difficulty.  Concern for cellulitis.  History/physical exam is not  concerning for DVT, septic arthritis, ischemic limb.  Doubt osteomyelitis.  We will plan for x-ray for evaluation of any acute bony abnormality.  X-ray reviewed.  Negative for any acute bony abnormality.  No evidence of gas noted be concerning for osteomyelitis.  Discussed results with patient.  We will plan to put  patient on antibiotics for possible cellulitis.  Question if this was induced by removing toenail.  At this time, he is hemodynamically stable.  At this time, patient exhibits no emergent life-threatening condition that require further evaluation in ED or admission. Patient had ample opportunity for questions and discussion. All patient's questions were answered with full understanding. Strict return precautions discussed. Patient expresses understanding and agreement to plan.   Portions of this note were generated with Scientist, clinical (histocompatibility and immunogenetics). Dictation errors may occur despite best attempts at proofreading.  Final Clinical Impression(s) / ED Diagnoses Final diagnoses:  Cellulitis of foot    Rx / DC Orders ED Discharge Orders         Ordered    cephALEXin (KEFLEX) 500 MG capsule  4 times daily     Discontinue  Reprint     09/30/19 1457           Maxwell Caul, PA-C 09/30/19 1528    Arby Barrette, MD 10/01/19 1209

## 2019-09-30 NOTE — Discharge Instructions (Signed)
Take antibiotics as directed. Please take all of your antibiotics until finished.  Follow-up with podiatry for further evaluation.  Return to emergency department for fevers, worsening redness or swelling that begins to spread up your leg, difficulty walking or any other worsening or concerning symptoms.

## 2019-09-30 NOTE — ED Triage Notes (Signed)
Pt reports foot pain and arm pain. Patient is homeless and has been seen for same in past.

## 2019-09-30 NOTE — ED Notes (Signed)
Patient provided with socks, bus pass, sandwich, and water at time of discharge.
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
## 2021-04-02 IMAGING — CR DG FOOT COMPLETE 3+V*L*
3 series · 3 of 3 positions shown · non-contrast
Comparison: 06/19/2019

CLINICAL DATA: Foot pain.  No trauma history submitted.

EXAM:
LEFT FOOT - COMPLETE 3+ VIEW

[x foot ap left]
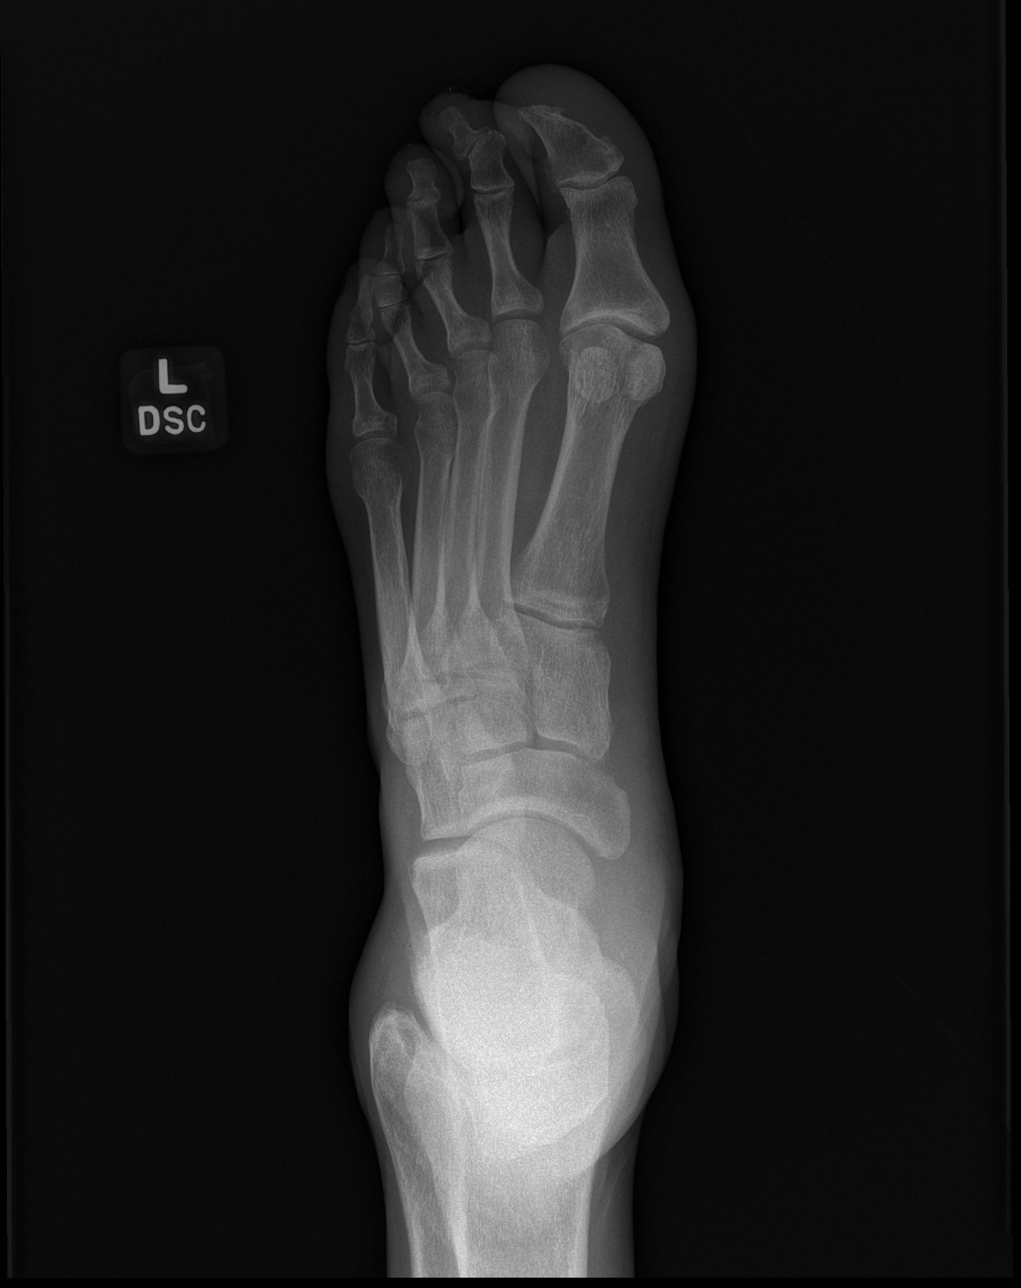

[x foot obl left]
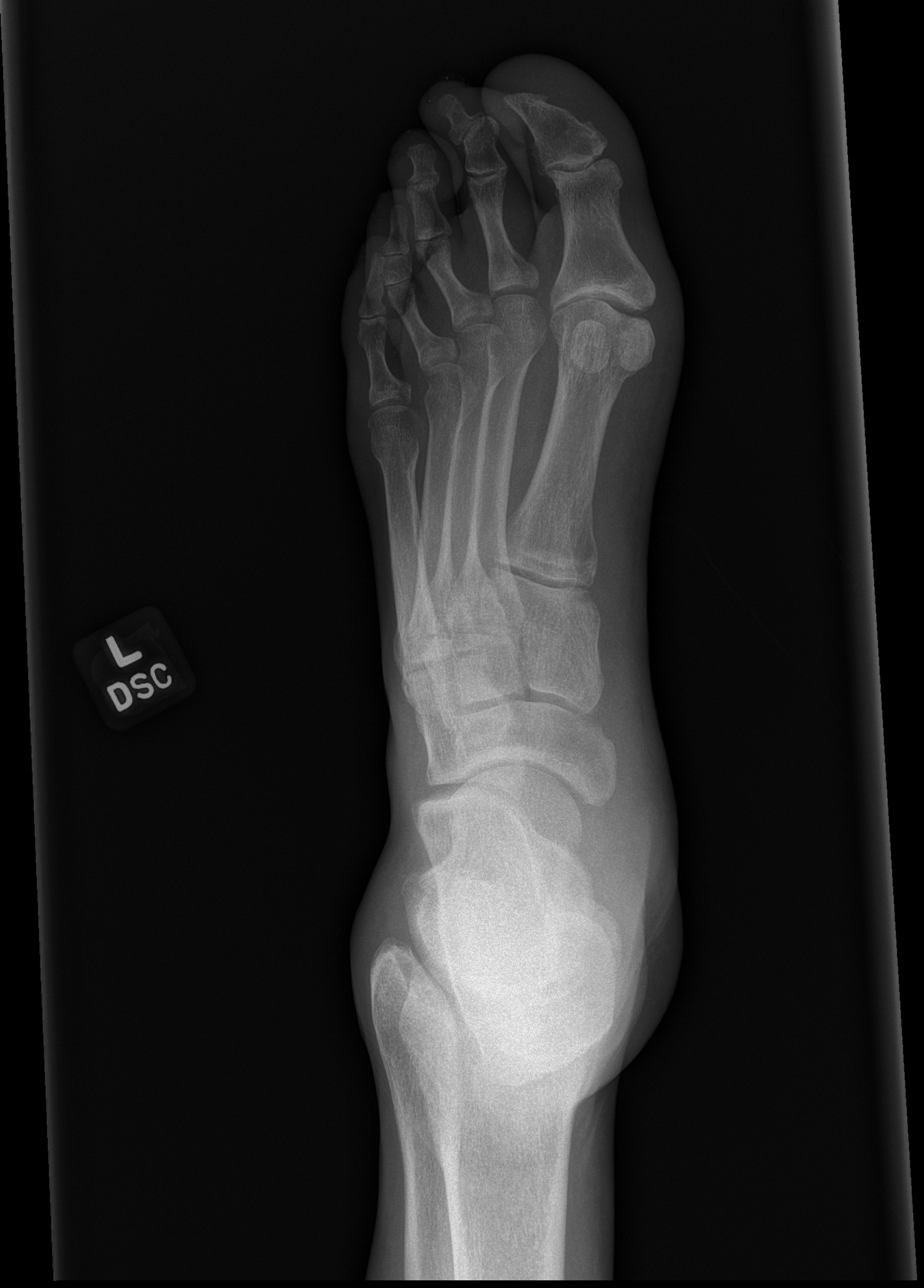

[x foot lat left]
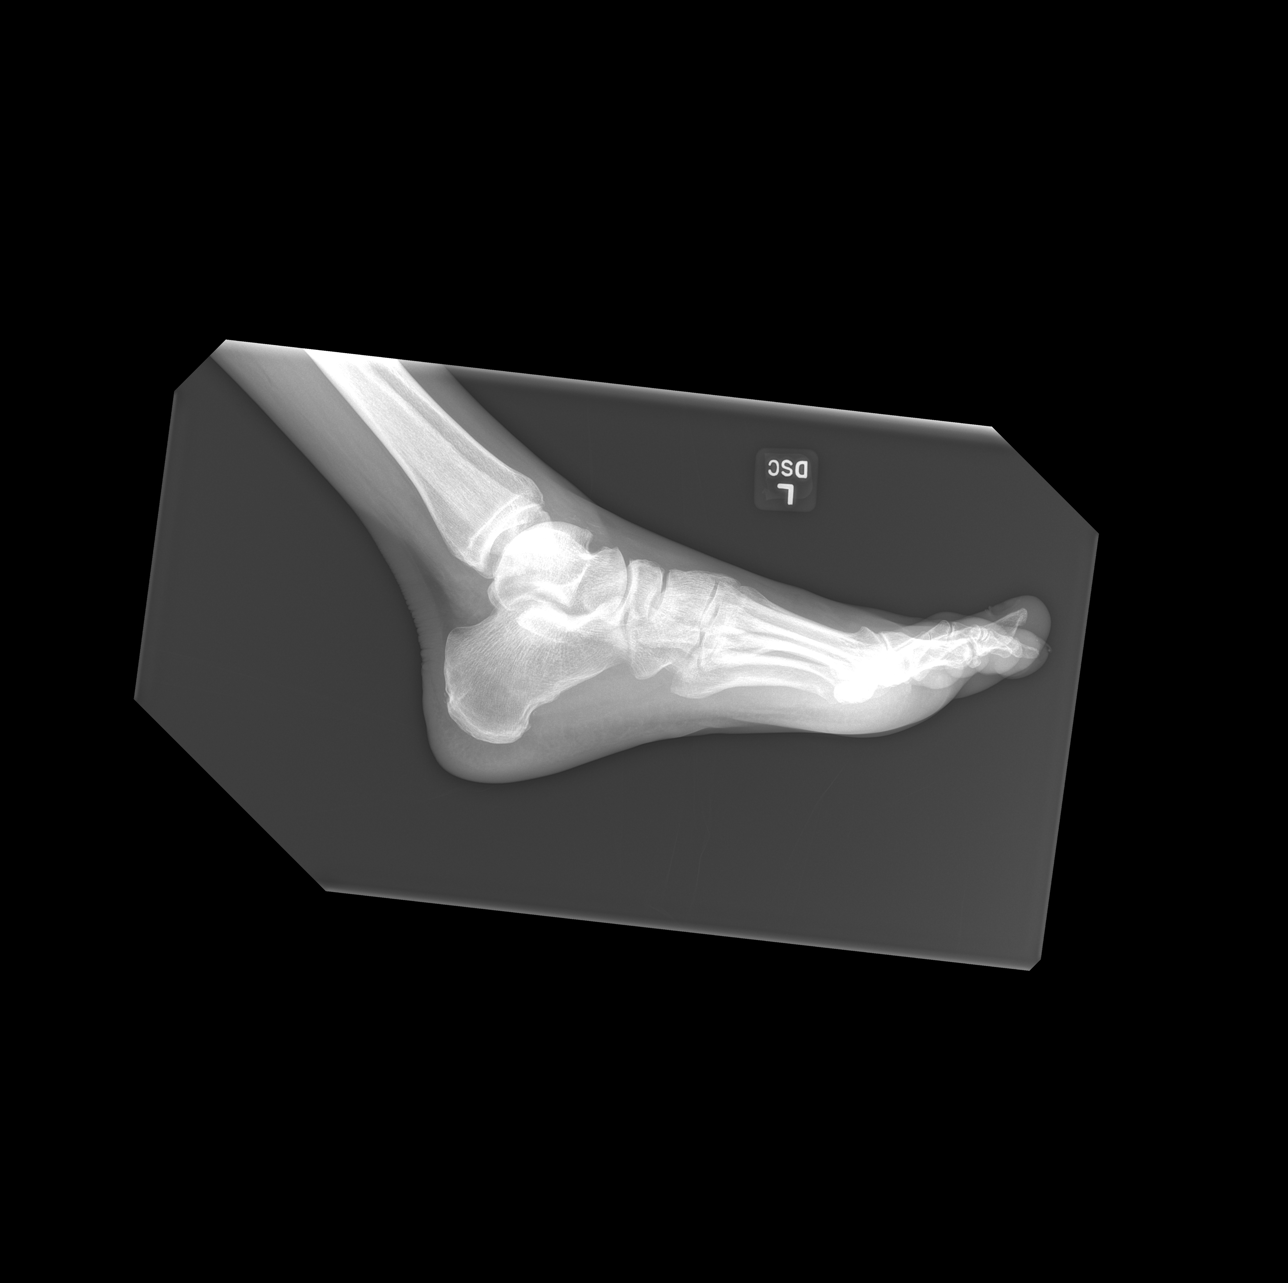

[3 of 3 positions shown; findings below may reference images not displayed]

FINDINGS: Mild hallux valgus deformity with degenerative changes the first
metatarsophalangeal joint again identified. No acute fracture or
dislocation. No periosteal reaction or callus deposition. No
radiopaque foreign object.
IMPRESSION: No acute osseous abnormality.

## 2022-01-31 ENCOUNTER — Emergency Department (HOSPITAL_COMMUNITY): Payer: Medicare Other

## 2022-01-31 ENCOUNTER — Other Ambulatory Visit: Payer: Self-pay

## 2022-01-31 ENCOUNTER — Emergency Department (HOSPITAL_COMMUNITY)
Admission: EM | Admit: 2022-01-31 | Discharge: 2022-01-31 | Discharge status: Against Medical Advice | Payer: Medicare Other | Attending: Emergency Medicine | Admitting: Emergency Medicine

## 2022-01-31 DIAGNOSIS — Z5321 Procedure and treatment not carried out due to patient leaving prior to being seen by health care provider: Secondary | ICD-10-CM | POA: Diagnosis not present

## 2022-01-31 DIAGNOSIS — R519 Headache, unspecified: Secondary | ICD-10-CM | POA: Insufficient documentation

## 2022-01-31 DIAGNOSIS — R42 Dizziness and giddiness: Secondary | ICD-10-CM | POA: Insufficient documentation

## 2022-01-31 LAB — CBC WITH DIFFERENTIAL/PLATELET
Abs Immature Granulocytes: 0.04 10*3/uL (ref 0.00–0.07)
Basophils Absolute: 0 10*3/uL (ref 0.0–0.1)
Basophils Relative: 0 %
Eosinophils Absolute: 0 10*3/uL (ref 0.0–0.5)
Eosinophils Relative: 0 %
HCT: 47.7 % (ref 39.0–52.0)
Hemoglobin: 15.3 g/dL (ref 13.0–17.0)
Immature Granulocytes: 0 %
Lymphocytes Relative: 11 %
Lymphs Abs: 1.1 10*3/uL (ref 0.7–4.0)
MCH: 26.9 pg (ref 26.0–34.0)
MCHC: 32.1 g/dL (ref 30.0–36.0)
MCV: 83.8 fL (ref 80.0–100.0)
Monocytes Absolute: 0.7 10*3/uL (ref 0.1–1.0)
Monocytes Relative: 6 %
Neutro Abs: 8.8 10*3/uL — ABNORMAL HIGH (ref 1.7–7.7)
Neutrophils Relative %: 83 %
Platelets: 309 10*3/uL (ref 150–400)
RBC: 5.69 MIL/uL (ref 4.22–5.81)
RDW: 14.3 % (ref 11.5–15.5)
WBC: 10.7 10*3/uL — ABNORMAL HIGH (ref 4.0–10.5)
nRBC: 0 % (ref 0.0–0.2)

## 2022-01-31 LAB — BASIC METABOLIC PANEL
Anion gap: 12 (ref 5–15)
BUN: 10 mg/dL (ref 8–23)
CO2: 27 mmol/L (ref 22–32)
Calcium: 9.7 mg/dL (ref 8.9–10.3)
Chloride: 95 mmol/L — ABNORMAL LOW (ref 98–111)
Creatinine, Ser: 0.73 mg/dL (ref 0.61–1.24)
GFR, Estimated: >60 mL/min (ref >60)
Glucose, Bld: 127 mg/dL — ABNORMAL HIGH (ref 70–99)
Potassium: 4.4 mmol/L (ref 3.5–5.1)
Sodium: 134 mmol/L — ABNORMAL LOW (ref 135–145)

## 2022-01-31 NOTE — ED Triage Notes (Signed)
Pt BIB [facility] from [facility]. C/o feeling dizzy. States similar sensation before having a seizure. Has not had a seizure. Unable to say when he last took seizure medication.

## 2022-01-31 NOTE — ED Notes (Signed)
Called twice by lobby tech and no answer

## 2022-01-31 NOTE — ED Notes (Signed)
Called Pt. 2X no answer 

## 2022-01-31 NOTE — ED Provider Triage Note (Signed)
Emergency Medicine Provider Triage Evaluation Note  Raymond Miles , a 71 y.o. male  was evaluated in triage.  Pt complains of dizziness and a headache, started about 2 hours ago, states he feels like his head is going to explode, denies any new paresthesias or weakness of the upper or lower extremities, no recent head trauma, not anticoag's..  Review of Systems  Positive: Headache, dizziness Negative: Chest pain, shortness of breath  Physical Exam  BP (!) 116/97 (BP Location: Right Arm)   Pulse 89   Temp 97.9 F (36.6 C)   Resp 18   Ht 5\' 11"  (1.803 m)   Wt 75.3 kg   SpO2 94%   BMI 23.15 kg/m  Gen:   Awake, no distress   Resp:  Normal effort  MSK:   Moves extremities without difficulty  Other:  Cranial nerves II through XII gross intact no difficulty with word finding following two-step commands there is no unilateral weakness present during my examination.  Medical Decision Making  Medically screening exam initiated at 5:03 AM.  Appropriate orders placed.  Randen Kauth was informed that the remainder of the evaluation will be completed by another provider, this initial triage assessment does not replace that evaluation, and the importance of remaining in the ED until their evaluation is complete.  Lab work imaging been ordered will need further work-up.   [name], PA-C 01/31/22 0505

## 2022-02-10 ENCOUNTER — Emergency Department (HOSPITAL_COMMUNITY)
Admission: EM | Admit: 2022-02-10 | Discharge: 2022-02-11 | Disposition: A | Discharge status: Home / Self Care | Payer: Medicare Other | Attending: Emergency Medicine | Admitting: Emergency Medicine

## 2022-02-10 ENCOUNTER — Emergency Department (HOSPITAL_COMMUNITY): Payer: Medicare Other

## 2022-02-10 ENCOUNTER — Encounter (HOSPITAL_COMMUNITY): Payer: Self-pay

## 2022-02-10 ENCOUNTER — Other Ambulatory Visit: Payer: Self-pay

## 2022-02-10 DIAGNOSIS — R531 Weakness: Secondary | ICD-10-CM | POA: Insufficient documentation

## 2022-02-10 DIAGNOSIS — M542 Cervicalgia: Secondary | ICD-10-CM | POA: Diagnosis present

## 2022-02-10 DIAGNOSIS — E876 Hypokalemia: Secondary | ICD-10-CM | POA: Diagnosis not present

## 2022-02-10 DIAGNOSIS — M4802 Spinal stenosis, cervical region: Secondary | ICD-10-CM | POA: Insufficient documentation

## 2022-02-10 DIAGNOSIS — M25512 Pain in left shoulder: Secondary | ICD-10-CM

## 2022-02-10 MED ORDER — OXYCODONE-ACETAMINOPHEN 5-325 MG PO TABS
1.0000 | ORAL_TABLET | Freq: Once | ORAL | Status: AC
  Administered 2022-02-10: 1 via ORAL
  Filled 2022-02-10: qty 1

## 2022-02-10 NOTE — ED Provider Notes (Signed)
[facility] DEPT Provider Note   CSN: [id number] Arrival date & time: 02/10/22  2215     History  Chief Complaint  Patient presents with   Extremity Pain    Raymond Miles is a 71 y.o. male with past medical history significant for schizophrenia, seizures, previous MI who endorses 2 previous strokes in the last year reports that he has had some left arm pain for a year but worse today.  He reports that he thinks it is radiating from his neck, he was advised at some point to have a CT to ensure he does not have some kind of nerve impingement going on in his neck.  He denies new fall or injury.  He denies unilateral numbness, he does endorse some weakness on the left but reports that it is secondary to pain.  Denies any vision changes, garbled speech, confusion.   Extremity Pain       Home Medications Prior to Admission medications   Medication Sig Start Date End Date Taking? Authorizing Provider  benztropine (COGENTIN) 0.5 MG tablet Take 0.5 mg by mouth 2 (two) times daily.    [provider]  haloperidol decanoate (HALDOL DECANOATE) 50 MG/ML injection Inject 50 mg into the muscle every 28 (twenty-eight) days.    [provider]      Allergies    Mellaril [thioridazine]    Review of Systems   Review of Systems  Musculoskeletal:  Positive for arthralgias and neck pain.  All other systems reviewed and are negative.   Physical Exam Updated Vital Signs BP (!) 146/109 (BP Location: Left Arm)   Pulse 84   Temp 98.3 F (36.8 C) (Oral)   Resp 17   Ht 5\' 11"  (1.803 m)   Wt 65.8 kg   SpO2 99%   BMI 20.22 kg/m  Physical Exam Vitals and nursing note reviewed.  Constitutional:      General: He is not in acute distress.    Appearance: Normal appearance.  HENT:     Head: Normocephalic and atraumatic.  Eyes:     General:        Right eye: No discharge.        Left eye: No discharge.  Cardiovascular:     Rate and Rhythm:  Normal rate and regular rhythm.     Heart sounds: No murmur heard.    No friction rub. No gallop.  Pulmonary:     Effort: Pulmonary effort is normal.     Breath sounds: Normal breath sounds.  Abdominal:     General: Bowel sounds are normal.     Palpations: Abdomen is soft.  Musculoskeletal:     Comments: Tenderness to palpation left cervical paraspinous muscles, left shoulder, no midline cervical, thoracic, or lumbar spinal tenderness, intact strength 5/5 bilateral upper and lower extremities.  Skin:    General: Skin is warm and dry.     Capillary Refill: Capillary refill takes less than 2 seconds.  Neurological:     Mental Status: He is alert and oriented to person, place, and time.     Comments: Cranial nerves II through XII grossly intact.  Intact finger-nose, intact heel-to-shin.  Romberg negative, gait normal.  Alert and oriented x3.  Moves all 4 limbs spontaneously, normal coordination.  No pronator drift.  Intact strength 5 out of 5 bilateral upper and lower extremities.    Psychiatric:        Mood and Affect: Mood normal.  Behavior: Behavior normal.     ED Results / Procedures / Treatments   Labs (all labs ordered are listed, but only abnormal results are displayed) Labs Reviewed  BASIC METABOLIC PANEL - Abnormal; Notable for the following components:      Result Value   Potassium 3.3 (*)    Glucose, Bld 129 (*)    All other components within normal limits  CBC    EKG None  Radiology CT Head Wo Contrast  Result Date: 02/11/2022 CLINICAL DATA:  History of 2 strokes last year. Subsequent left arm pain which has been worsening Cervical radiculopathy EXAM: CT HEAD WITHOUT CONTRAST CT CERVICAL SPINE WITHOUT CONTRAST TECHNIQUE: Multidetector CT imaging of the head and cervical spine was performed following the standard protocol without intravenous contrast. Multiplanar CT image reconstructions of the cervical spine were also generated. RADIATION DOSE REDUCTION:  This exam was performed according to the departmental dose-optimization program which includes automated exposure control, adjustment of the mA and/or kV according to patient size and/or use of iterative reconstruction technique. COMPARISON:  CT head 01/31/2022; CT cervical spine 09/24/2019 FINDINGS: CT HEAD FINDINGS Brain: No intracranial hemorrhage, mass effect, or evidence of acute infarct. No hydrocephalus. No extra-axial fluid collection. Generalized cerebral atrophy. Ill-defined hypoattenuation within the cerebral white matter is nonspecific but consistent with chronic small vessel ischemic disease. Vascular: No hyperdense vessel. Intracranial arterial calcification. Skull: No fracture or focal lesion. Sinuses/Orbits: No acute finding. Paranasal sinuses and mastoid air cells are well aerated. Other: None. CT CERVICAL SPINE FINDINGS Alignment: No evidence of traumatic malalignment. Skull base and vertebrae: No acute fracture. No primary bone lesion or focal pathologic process. Soft tissues and spinal canal: No prevertebral fluid or swelling. No visible canal hematoma. Disc levels: Multilevel spondylosis, disc space height loss and degenerative endplate changes greatest at C3-C4 where there is complete loss of joint space and ankylosis of the vertebral bodies. Multilevel cervical facet arthropathy greatest at C3-C4 on the right where there is facet ankylosis. Additional advanced disc space height loss at C5-C6. Posterior disc osteophyte complex at C4-C5 causes moderate spinal canal narrowing. Multilevel uncovertebral spurring and facet arthropathy cause advanced neural foraminal narrowing bilaterally from C2-C6. Upper chest: Negative. Other: None. IMPRESSION: No acute intracranial abnormality. No acute cervical spine fracture. Multilevel advanced spondylosis with ankylosis of the C4 and C5 vertebral bodies. Posterior disc osteophyte complex at C4-C5 causes moderate spinal canal narrowing. Bilateral advanced  neural foraminal narrowing secondary to uncovertebral spurring and facet arthropathy from C2-C6. Electronically Signed   By: Placido Sou M.D.   On: 02/11/2022 00:06   CT Cervical Spine Wo Contrast  Result Date: 02/11/2022 CLINICAL DATA:  History of 2 strokes last year. Subsequent left arm pain which has been worsening Cervical radiculopathy EXAM: CT HEAD WITHOUT CONTRAST CT CERVICAL SPINE WITHOUT CONTRAST TECHNIQUE: Multidetector CT imaging of the head and cervical spine was performed following the standard protocol without intravenous contrast. Multiplanar CT image reconstructions of the cervical spine were also generated. RADIATION DOSE REDUCTION: This exam was performed according to the departmental dose-optimization program which includes automated exposure control, adjustment of the mA and/or kV according to patient size and/or use of iterative reconstruction technique. COMPARISON:  CT head 01/31/2022; CT cervical spine 09/24/2019 FINDINGS: CT HEAD FINDINGS Brain: No intracranial hemorrhage, mass effect, or evidence of acute infarct. No hydrocephalus. No extra-axial fluid collection. Generalized cerebral atrophy. Ill-defined hypoattenuation within the cerebral white matter is nonspecific but consistent with chronic small vessel ischemic disease. Vascular: No hyperdense vessel. Intracranial arterial  calcification. Skull: No fracture or focal lesion. Sinuses/Orbits: No acute finding. Paranasal sinuses and mastoid air cells are well aerated. Other: None. CT CERVICAL SPINE FINDINGS Alignment: No evidence of traumatic malalignment. Skull base and vertebrae: No acute fracture. No primary bone lesion or focal pathologic process. Soft tissues and spinal canal: No prevertebral fluid or swelling. No visible canal hematoma. Disc levels: Multilevel spondylosis, disc space height loss and degenerative endplate changes greatest at C3-C4 where there is complete loss of joint space and ankylosis of the vertebral  bodies. Multilevel cervical facet arthropathy greatest at C3-C4 on the right where there is facet ankylosis. Additional advanced disc space height loss at C5-C6. Posterior disc osteophyte complex at C4-C5 causes moderate spinal canal narrowing. Multilevel uncovertebral spurring and facet arthropathy cause advanced neural foraminal narrowing bilaterally from C2-C6. Upper chest: Negative. Other: None. IMPRESSION: No acute intracranial abnormality. No acute cervical spine fracture. Multilevel advanced spondylosis with ankylosis of the C4 and C5 vertebral bodies. Posterior disc osteophyte complex at C4-C5 causes moderate spinal canal narrowing. Bilateral advanced neural foraminal narrowing secondary to uncovertebral spurring and facet arthropathy from C2-C6. Electronically Signed   By: Minerva Fester M.D.   On: 02/11/2022 00:06   DG Shoulder Left  Result Date: 02/10/2022 CLINICAL DATA:  Left shoulder nerve pain for 1 year, post stroke. EXAM: LEFT SHOULDER - 2+ VIEW COMPARISON:  01/16/2019. FINDINGS: There is no evidence of fracture or dislocation. Moderate degenerative changes are noted at the acromioclavicular and glenohumeral joints. There is superior subluxation of the left humeral head suggesting chronic rotator cuff tear. Sternotomy wires are noted. Soft tissues are unremarkable. IMPRESSION: 1. No acute fracture or dislocation. 2. Moderate degenerative changes at the acromioclavicular and glenohumeral joints. 3. Superior subluxation of the left humeral head suggesting chronic rotator cuff tear. Electronically Signed   By: Thornell Sartorius M.D.   On: 02/10/2022 23:46    Procedures Procedures    Medications Ordered in ED Medications  oxyCODONE-acetaminophen (PERCOCET/ROXICET) 5-325 MG per tablet 1 tablet (1 tablet Oral Given 02/10/22 2358)  potassium chloride SA (KLOR-CON M) CR tablet 40 mEq (40 mEq Oral Given 02/11/22 0118)    ED Course/ Medical Decision Making/ A&P                            Medical Decision Making Amount and/or Complexity of Data Reviewed Labs: ordered. Radiology: ordered.  Risk Prescription drug management.   This is an overall well-appearing 71 year old male with past medical history significant for schizophrenia, patient endorses previous history of stroke, and chronic left neck and left shoulder pain.  When asked if his symptoms today of worsening left arm pain, weakness are new for him he reports that they are not, although the pain was slightly worse today secondary to cold weather.  Patient denies any numbness, tingling, new weakness, but does endorse some weakness secondary to pain.  My emergent differential diagnosis includes stroke, cervical spinal stenosis, cervical radiculopathy, rotator cuff pathology, acute fracture, dislocation, versus other.  This is not an exhaustive differential.  I dependently interpreted lab work including BMP, CBC, his BMP is notable for mild hypokalemia, we will orally replete.  I independently interpreted imaging including plain film of the left shoulder, CT cervical spine, CT head without contrast which shows patient does have some moderate canal stenosis around C6, C7, he has some chronic left rotator cuff pathology noted. I agree with the radiologist interpretation.  Overall patient has a number of physical  exam and imaging findings that may explain his left arm weakness including his moderate cervical canal stenosis, as well as chronic rotator cuff injury on the left, however by patient's own report his symptoms have been going on for at least a year or more.  He has had no recent traumatic injury.  Other than some weakness which seems largely secondary to pain he is having no neurologic deficits, intact strength bilateral upper and lower extremities when prompted.  He has no sensory deficits.  Discussed that I would recommend outpatient neurosurgical follow-up given his moderate cervical canal stenosis, as well as rotator  cuff rehab exercises, otherwise no additional treatment needed at this time.  Patient understands agrees plan, discharged in stable condition at this time. Final Clinical Impression(s) / ED Diagnoses Final diagnoses:  Neck pain  Acute pain of left shoulder  Cervical stenosis of spinal canal    Rx / DC Orders ED Discharge Orders     None         Dorien Chihuahua 02/11/22 0129    Veryl Speak, MD 02/11/22 [phone]

## 2022-02-10 NOTE — ED Provider Notes (Incomplete)
[facility]-EMERGENCY DEPT Provider Note   CSN: [id number] Arrival date & time: 02/10/22  2215     History {Add pertinent medical, surgical, social history, OB history to HPI:1} Chief Complaint  Patient presents with  . Extremity Pain    Raymond Miles is a 71 y.o. male with past medical history significant for schizophrenia, seizures, previous MI who endorses 2 previous strokes in the last year reports that he has had some left arm pain for a year but worse today.  He reports that he thinks it is radiating from his neck, he was advised at some point to have a CT to ensure he does not have some kind of nerve impingement going on in his neck.  He denies new fall or injury.  He denies unilateral numbness, he does endorse some weakness on the left but reports that it is secondary to pain.  Denies any vision changes, garbled speech, confusion.   Extremity Pain       Home Medications Prior to Admission medications   Medication Sig Start Date End Date Taking? Authorizing Provider  benztropine (COGENTIN) 0.5 MG tablet Take 0.5 mg by mouth 2 (two) times daily.    [provider]  haloperidol decanoate (HALDOL DECANOATE) 50 MG/ML injection Inject 50 mg into the muscle every 28 (twenty-eight) days.    [provider]      Allergies    Mellaril [thioridazine]    Review of Systems   Review of Systems  Musculoskeletal:  Positive for arthralgias and neck pain.  All other systems reviewed and are negative.   Physical Exam Updated Vital Signs BP (!) 146/109 (BP Location: Left Arm)   Pulse 84   Temp 98.3 F (36.8 C) (Oral)   Resp 17   Ht 5\' 11"  (1.803 m)   Wt 65.8 kg   SpO2 99%   BMI 20.22 kg/m  Physical Exam Vitals and nursing note reviewed.  Constitutional:      General: He is not in acute distress.    Appearance: Normal appearance.  HENT:     Head: Normocephalic and atraumatic.  Eyes:     General:        Right eye: No discharge.         Left eye: No discharge.  Cardiovascular:     Rate and Rhythm: Normal rate and regular rhythm.     Heart sounds: No murmur heard.    No friction rub. No gallop.  Pulmonary:     Effort: Pulmonary effort is normal.     Breath sounds: Normal breath sounds.  Abdominal:     General: Bowel sounds are normal.     Palpations: Abdomen is soft.  Musculoskeletal:     Comments: Tenderness to palpation left cervical paraspinous muscles, left shoulder, no midline cervical, thoracic, or lumbar spinal tenderness, intact strength 5/5 bilateral upper and lower extremities.  Skin:    General: Skin is warm and dry.     Capillary Refill: Capillary refill takes less than 2 seconds.  Neurological:     Mental Status: He is alert and oriented to person, place, and time.     Comments: Cranial nerves II through XII grossly intact.  Intact finger-nose, intact heel-to-shin.  Romberg negative, gait normal.  Alert and oriented x3.  Moves all 4 limbs spontaneously, normal coordination.  No pronator drift.  Intact strength 5 out of 5 bilateral upper and lower extremities.    Psychiatric:        Mood and Affect:  Mood normal.        Behavior: Behavior normal.     ED Results / Procedures / Treatments   Labs (all labs ordered are listed, but only abnormal results are displayed) Labs Reviewed  CBC  BASIC METABOLIC PANEL    EKG None  Radiology No results found.  Procedures Procedures  {Document cardiac monitor, telemetry assessment procedure when appropriate:1}  Medications Ordered in ED Medications  oxyCODONE-acetaminophen (PERCOCET/ROXICET) 5-325 MG per tablet 1 tablet (has no administration in time range)    ED Course/ Medical Decision Making/ A&P                           Medical Decision Making Amount and/or Complexity of Data Reviewed Labs: ordered. Radiology: ordered.  Risk Prescription drug management.   ***  {Document critical care time when appropriate:1} {Document review of labs  and clinical decision tools ie heart score, Chads2Vasc2 etc:1}  {Document your independent review of radiology images, and any outside records:1} {Document your discussion with family members, caretakers, and with consultants:1} {Document social determinants of health affecting pt's care:1} {Document your decision making why or why not admission, treatments were needed:1} Final Clinical Impression(s) / ED Diagnoses Final diagnoses:  None    Rx / DC Orders ED Discharge Orders     None

## 2022-02-10 NOTE — ED Triage Notes (Signed)
[place], states that he has had two strokes in the last year and ever since he has had left arm pain, but states he has gotten worse today.

## 2022-02-11 DIAGNOSIS — M4802 Spinal stenosis, cervical region: Secondary | ICD-10-CM | POA: Diagnosis not present

## 2022-02-11 LAB — BASIC METABOLIC PANEL
Anion gap: 7 (ref 5–15)
BUN: 11 mg/dL (ref 8–23)
CO2: 27 mmol/L (ref 22–32)
Calcium: 9.1 mg/dL (ref 8.9–10.3)
Chloride: 105 mmol/L (ref 98–111)
Creatinine, Ser: 0.75 mg/dL (ref 0.61–1.24)
GFR, Estimated: >60 mL/min (ref >60)
Glucose, Bld: 129 mg/dL — ABNORMAL HIGH (ref 70–99)
Potassium: 3.3 mmol/L — ABNORMAL LOW (ref 3.5–5.1)
Sodium: 139 mmol/L (ref 135–145)

## 2022-02-11 LAB — CBC
HCT: 44.4 % (ref 39.0–52.0)
Hemoglobin: 13.9 g/dL (ref 13.0–17.0)
MCH: 26.7 pg (ref 26.0–34.0)
MCHC: 31.3 g/dL (ref 30.0–36.0)
MCV: 85.4 fL (ref 80.0–100.0)
Platelets: 335 10*3/uL (ref 150–400)
RBC: 5.2 MIL/uL (ref 4.22–5.81)
RDW: 15.5 % (ref 11.5–15.5)
WBC: 6.4 10*3/uL (ref 4.0–10.5)
nRBC: 0 % (ref 0.0–0.2)

## 2022-02-11 MED ORDER — POTASSIUM CHLORIDE CRYS ER 20 MEQ PO TBCR
40.0000 meq | EXTENDED_RELEASE_TABLET | Freq: Once | ORAL | Status: AC
  Administered 2022-02-11: 40 meq via ORAL
  Filled 2022-02-11: qty 2

## 2022-02-11 NOTE — Discharge Instructions (Addendum)
Please follow-up outpatient neurosurgery, Please use Tylenol or ibuprofen for pain.  You may use 600 mg ibuprofen every 6 hours or 1000 mg of Tylenol every 6 hours.  You may choose to alternate between the 2.  This would be most effective.  Not to exceed 4 g of Tylenol within 24 hours.  Not to exceed 3200 mg ibuprofen 24 hours.

## 2022-03-25 ENCOUNTER — Other Ambulatory Visit: Payer: Self-pay

## 2022-03-25 ENCOUNTER — Emergency Department (HOSPITAL_COMMUNITY): Payer: Medicare Other

## 2022-03-25 ENCOUNTER — Encounter (HOSPITAL_COMMUNITY): Payer: Self-pay

## 2022-03-25 ENCOUNTER — Emergency Department (HOSPITAL_COMMUNITY)
Admission: EM | Admit: 2022-03-25 | Discharge: 2022-03-25 | Disposition: A | Discharge status: Home / Self Care | Payer: Medicare Other | Attending: Emergency Medicine | Admitting: Emergency Medicine

## 2022-03-25 DIAGNOSIS — Z1152 Encounter for screening for COVID-19: Secondary | ICD-10-CM | POA: Diagnosis not present

## 2022-03-25 DIAGNOSIS — Z5900 Homelessness unspecified: Secondary | ICD-10-CM | POA: Diagnosis not present

## 2022-03-25 DIAGNOSIS — R052 Subacute cough: Secondary | ICD-10-CM

## 2022-03-25 DIAGNOSIS — R9431 Abnormal electrocardiogram [ECG] [EKG]: Secondary | ICD-10-CM | POA: Insufficient documentation

## 2022-03-25 DIAGNOSIS — R059 Cough, unspecified: Secondary | ICD-10-CM | POA: Diagnosis present

## 2022-03-25 LAB — COMPREHENSIVE METABOLIC PANEL
ALT: 24 U/L (ref 0–44)
AST: 35 U/L (ref 15–41)
Albumin: 3.3 g/dL — ABNORMAL LOW (ref 3.5–5.0)
Alkaline Phosphatase: 49 U/L (ref 38–126)
Anion gap: 11 (ref 5–15)
BUN: 9 mg/dL (ref 8–23)
CO2: 24 mmol/L (ref 22–32)
Calcium: 8.6 mg/dL — ABNORMAL LOW (ref 8.9–10.3)
Chloride: 102 mmol/L (ref 98–111)
Creatinine, Ser: 0.71 mg/dL (ref 0.61–1.24)
GFR, Estimated: >60 mL/min (ref >60)
Glucose, Bld: 158 mg/dL — ABNORMAL HIGH (ref 70–99)
Potassium: 3.3 mmol/L — ABNORMAL LOW (ref 3.5–5.1)
Sodium: 137 mmol/L (ref 135–145)
Total Bilirubin: 0.5 mg/dL (ref 0.3–1.2)
Total Protein: 6.6 g/dL (ref 6.5–8.1)

## 2022-03-25 LAB — CBC
HCT: 39 % (ref 39.0–52.0)
Hemoglobin: 12.7 g/dL — ABNORMAL LOW (ref 13.0–17.0)
MCH: 27.7 pg (ref 26.0–34.0)
MCHC: 32.6 g/dL (ref 30.0–36.0)
MCV: 85 fL (ref 80.0–100.0)
Platelets: 339 10*3/uL (ref 150–400)
RBC: 4.59 MIL/uL (ref 4.22–5.81)
RDW: 15.9 % — ABNORMAL HIGH (ref 11.5–15.5)
WBC: 3.6 10*3/uL — ABNORMAL LOW (ref 4.0–10.5)
nRBC: 0 % (ref 0.0–0.2)

## 2022-03-25 LAB — RESP PANEL BY RT-PCR (RSV, FLU A&B, COVID)  RVPGX2
Influenza A by PCR: NEGATIVE
Influenza B by PCR: NEGATIVE
Resp Syncytial Virus by PCR: NEGATIVE
SARS Coronavirus 2 by RT PCR: NEGATIVE

## 2022-03-25 LAB — TROPONIN I (HIGH SENSITIVITY): Troponin I (High Sensitivity): 14 ng/L (ref <18)

## 2022-03-25 NOTE — Discharge Instructions (Signed)
You were seen for your cough in the emergency department.   At home, please take tea with honey or over-the-counter cough drops for your cough.    Check your MyChart online for the results of any tests that had not resulted by the time you left the emergency department.   Follow-up with your primary doctor in 2-3 days regarding your visit.  If you do not have a primary doctor please follow-up with [facility] to establish care.  Return immediately to the emergency department if you experience any of the following: Difficulty breathing, chest pain, or any other concerning symptoms.    Thank you for visiting our Emergency Department. It was a pleasure taking care of you today.

## 2022-03-25 NOTE — ED Provider Triage Note (Signed)
Emergency Medicine Provider Triage Evaluation Note  Natale Thoma , a 72 y.o. male  was evaluated in triage.  Pt complains of left sided chest pain and abdominal pain.  Pt complains of a cough   Review of Systems  Positive: fever Negative: vomiting  Physical Exam  BP (!) 139/96 (BP Location: Left Arm)   Pulse (!) 102   Temp 99.6 F (37.6 C)   Resp 18   Ht 5\' 11"  (1.803 m)   Wt 76.2 kg   SpO2 99%   BMI 23.43 kg/m  Gen:   Awake, difficult to understand Resp:  Normal effort.  Harsh cough MSK:    Other:    Medical Decision Making  Medically screening exam initiated at 1:59 PM.  Appropriate orders placed.  Aadith Raudenbush was informed that the remainder of the evaluation will be completed by another provider, this initial triage assessment does not replace that evaluation, and the importance of remaining in the ED until their evaluation is complete.     Fransico Meadow, [place] 03/25/22 1400

## 2022-03-25 NOTE — ED Notes (Signed)
Dc instructions printed for pt. Pt signed AMA due to not wanting additional work up provider has ordered. Assisted to lobby with lots of belongings

## 2022-03-25 NOTE — ED Notes (Signed)
Patient transported to X-ray 

## 2022-03-25 NOTE — ED Notes (Signed)
The patient did not answer for triage 

## 2022-03-25 NOTE — ED Triage Notes (Signed)
Patient BIB [facility] from the [facility] for evaluation of a cough that started a little over three weeks ago. EMS reports patient stated "I just want to be checked out to make sure everything is okay because I'm 72 years old. Also, tell them I want some hot food." Patient alert, oriented, ambulating independently with steady gait, and is in no apparent distress at this time.

## 2022-03-25 NOTE — ED Provider Notes (Signed)
[facility] EMERGENCY DEPARTMENT Provider Note   CSN: [id number] Arrival date & time: 03/25/22  1047     History  Chief Complaint  Patient presents with   Cough    Raymond Miles is a 72 y.o. male.  72 year old male with a history of homelessness, tobacco use, and prior MI who presents to the emergency department with cough for 3 weeks.  Patient reports for the past 3 weeks he has had a cough this been productive of yellow sputum.  Thinks that he has had night sweats but no other fevers.  Also with mild shortness of breath.  No lower extremity swelling.  No chest pains.  Says that his cough persisted so decided to come into the emergency department for evaluation.  Also was telling triage that he feels okay but wanted to be checked out because of his age and to get some food.  No history of COPD or DVT/PE.        Home Medications Prior to Admission medications   Medication Sig Start Date End Date Taking? Authorizing Provider  benztropine (COGENTIN) 0.5 MG tablet Take 0.5 mg by mouth 2 (two) times daily.    [provider]  haloperidol decanoate (HALDOL DECANOATE) 50 MG/ML injection Inject 50 mg into the muscle every 28 (twenty-eight) days.    [provider]      Allergies    Mellaril [thioridazine]    Review of Systems   Review of Systems  Physical Exam Updated Vital Signs BP (!) 139/96 (BP Location: Left Arm)   Pulse (!) 102   Temp 99.6 F (37.6 C)   Resp 18   Ht 5\' 11"  (1.803 m)   Wt 76.2 kg   SpO2 99%   BMI 23.43 kg/m  Physical Exam Vitals and nursing note reviewed.  Constitutional:      General: He is not in acute distress.    Appearance: He is well-developed.     Comments: Speaking in full sentences  HENT:     Head: Normocephalic and atraumatic.     Right Ear: External ear normal.     Left Ear: External ear normal.     Nose: Nose normal.  Eyes:     Extraocular Movements: Extraocular movements intact.      Conjunctiva/sclera: Conjunctivae normal.     Pupils: Pupils are equal, round, and reactive to light.  Cardiovascular:     Rate and Rhythm: Normal rate and regular rhythm.     Heart sounds: Normal heart sounds.  Pulmonary:     Effort: Pulmonary effort is normal. No respiratory distress.     Breath sounds: Normal breath sounds.  Musculoskeletal:     Cervical back: Normal range of motion and neck supple.     Right lower leg: No edema.     Left lower leg: No edema.  Skin:    General: Skin is warm and dry.  Neurological:     Mental Status: He is alert. Mental status is at baseline.  Psychiatric:        Mood and Affect: Mood normal.        Behavior: Behavior normal.     ED Results / Procedures / Treatments   Labs (all labs ordered are listed, but only abnormal results are displayed) Labs Reviewed  CBC - Abnormal; Notable for the following components:      Result Value   WBC 3.6 (*)    Hemoglobin 12.7 (*)    RDW 15.9 (*)  All other components within normal limits  COMPREHENSIVE METABOLIC PANEL - Abnormal; Notable for the following components:   Potassium 3.3 (*)    Glucose, Bld 158 (*)    Calcium 8.6 (*)    Albumin 3.3 (*)    All other components within normal limits  RESP PANEL BY RT-PCR (RSV, FLU A&B, COVID)  [id number]  D-DIMER, QUANTITATIVE  TROPONIN I (HIGH SENSITIVITY)  TROPONIN I (HIGH SENSITIVITY)    EKG EKG Interpretation  Date/Time:  Monday March 25 2022 14:04:06 EST Ventricular Rate:  100 PR Interval:  144 QRS Duration: 84 QT Interval:  352 QTC Calculation: 454 R Axis:   71 Text Interpretation: Normal sinus rhythm Cannot rule out Anterior infarct , age undetermined Abnormal ECG When compared with ECG of 27-Feb-2018 04:40, TWI in V3 is new Confirmed by Vonita Moss [phone]) on 03/25/2022 4:21:47 PM  Radiology DG Chest 2 View  Result Date: 03/25/2022 CLINICAL DATA:  Chest pain and shortness of breath 6 months. Cough 3 and half weeks. EXAM: CHEST - 2 VIEW  COMPARISON:  12/03/2017 FINDINGS: Patient is slightly rotated to the left. Two superior sternotomy wires unchanged. Lungs are adequately inflated without focal lobar consolidation or effusion. Cardiomediastinal silhouette and remainder of the exam is unchanged. IMPRESSION: No active cardiopulmonary disease. Electronically Signed   By: Elberta Fortis M.D.   On: 03/25/2022 15:56    Procedures Procedures   Medications Ordered in ED Medications - No data to display  ED Course/ Medical Decision Making/ A&P                           Medical Decision Making Amount and/or Complexity of Data Reviewed Labs: ordered. Radiology: ordered.   Raymond Miles is a 72 y.o. male with comorbidities that complicate the patient evaluation including homelessness, tobacco use, and prior MI who presents to the emergency department with cough for 3 weeks.   Initial Ddx:  COPD, pneumonia, COVID/flu, PE  MDM:  Unclear was causing the patient's symptoms that he is overall very well-appearing.  Feel that he may have a URI.  No obvious wheezing on exam that suggest COPD.  Will obtain chest x-ray to evaluate for pneumonia.  Feel PE less likely but patient due to his age and heart rate is not PE RC negative so will obtain D-dimer at this time.  Plan:  Labs D-dimer COVID/flu EKG Chest x-ray  ED Summary/Re-evaluation:  Patient reassessed and was stable in the emergency department.  EKG ordered from triage showed T wave inversion in V3 that was new.  Initial troponin was WNL.  Patient refused blood work including a D-dimer and repeat troponin.  Do feel the patient has capacity to do so.  Informed the patient that we are concerned about a blood clot in his lungs and/or heart attack which can both be life-threatening and could cause him to die and he stated that he understood but that he wanted to go at this time.  Patient signed out AMA.  Given resources and instructed to follow-up with his primary doctor in 2 to 3  days.  This patient presents to the ED for concern of complaints listed in HPI, this involves an extensive number of treatment options, and is a complaint that carries with it a high risk of complications and morbidity. Disposition including potential need for admission considered.   Dispo: AMA  Records reviewed ED Visit Notes The following labs were independently interpreted: Chemistry and show no acute  abnormality I independently reviewed the following imaging with scope of interpretation limited to determining acute life threatening conditions related to emergency care: Chest x-ray and agree with the radiologist interpretation with the following exceptions: none I personally reviewed and interpreted cardiac monitoring: normal sinus rhythm  I personally reviewed and interpreted the pt's EKG: see above for interpretation  I have reviewed the patients home medications and made adjustments as needed Social Determinants of health:  Homeless  Final Clinical Impression(s) / ED Diagnoses Final diagnoses:  Subacute cough  EKG abnormalities    Rx / DC Orders ED Discharge Orders     None         Fransico Meadow, MD 03/25/22 1718

## 2022-03-25 NOTE — ED Notes (Signed)
Pt stating he has had runny nose, cough, chest pain x 3.5 weeks.
